# Patient Record
Sex: Male | Born: 1945
Health system: Southern US, Community
[De-identification: ages and names within clinical notes are randomized; demographics above are authoritative.]

## PROBLEM LIST (undated history)

## (undated) DIAGNOSIS — E119 Type 2 diabetes mellitus without complications: Secondary | ICD-10-CM

## (undated) DIAGNOSIS — M1990 Unspecified osteoarthritis, unspecified site: Secondary | ICD-10-CM

## (undated) DIAGNOSIS — M7542 Impingement syndrome of left shoulder: Secondary | ICD-10-CM

## (undated) DIAGNOSIS — M24112 Other articular cartilage disorders, left shoulder: Secondary | ICD-10-CM

## (undated) DIAGNOSIS — T7840XA Allergy, unspecified, initial encounter: Secondary | ICD-10-CM

## (undated) DIAGNOSIS — I10 Essential (primary) hypertension: Secondary | ICD-10-CM

## (undated) DIAGNOSIS — N529 Male erectile dysfunction, unspecified: Secondary | ICD-10-CM

## (undated) DIAGNOSIS — E7800 Pure hypercholesterolemia, unspecified: Secondary | ICD-10-CM

## (undated) HISTORY — DX: Essential (primary) hypertension: I10

## (undated) HISTORY — DX: Allergy, unspecified, initial encounter: T78.40XA

## (undated) HISTORY — DX: Unspecified osteoarthritis, unspecified site: M19.90

## (undated) HISTORY — DX: Male erectile dysfunction, unspecified: N52.9

---

## 2012-12-06 ENCOUNTER — Ambulatory Visit
Admission: RE | Admit: 2012-12-06 | Discharge: 2012-12-06 | Disposition: A | Discharge status: Home / Self Care | Payer: BC Managed Care – PPO | Source: Ambulatory Visit | Attending: Family Medicine | Admitting: Family Medicine

## 2012-12-06 ENCOUNTER — Other Ambulatory Visit: Payer: Self-pay | Admitting: Family Medicine

## 2012-12-06 DIAGNOSIS — M545 Low back pain: Secondary | ICD-10-CM

## 2013-11-10 ENCOUNTER — Encounter: Payer: Self-pay | Admitting: Internal Medicine

## 2013-12-18 ENCOUNTER — Ambulatory Visit (AMBULATORY_SURGERY_CENTER): Payer: Self-pay

## 2013-12-18 VITALS — Ht 74.0 in | Wt 259.4 lb

## 2013-12-18 DIAGNOSIS — Z1211 Encounter for screening for malignant neoplasm of colon: Secondary | ICD-10-CM

## 2013-12-18 MED ORDER — MOVIPREP 100 G PO SOLR: 1.0000 | Freq: Once | ORAL | Status: DC

## 2013-12-18 NOTE — Progress Notes (Signed)
No allergies to eggs or soy No diet/weight loss meds No home oxygen No previous anesthesia Has email. Emmi instructions given for colonoscopy.

## 2013-12-22 ENCOUNTER — Encounter: Payer: Self-pay | Admitting: Internal Medicine

## 2013-12-23 ENCOUNTER — Ambulatory Visit: Payer: Medicare Other

## 2013-12-23 ENCOUNTER — Other Ambulatory Visit: Payer: Self-pay | Admitting: Neurosurgery

## 2013-12-23 DIAGNOSIS — I498 Other specified cardiac arrhythmias: Secondary | ICD-10-CM

## 2013-12-25 ENCOUNTER — Ambulatory Visit (INDEPENDENT_AMBULATORY_CARE_PROVIDER_SITE_OTHER): Payer: Medicare Other | Admitting: Cardiology

## 2013-12-25 ENCOUNTER — Encounter: Payer: Self-pay | Admitting: Cardiology

## 2013-12-25 VITALS — BP 140/78 | HR 90 | Ht 74.0 in | Wt 261.0 lb

## 2013-12-25 DIAGNOSIS — R0989 Other specified symptoms and signs involving the circulatory and respiratory systems: Secondary | ICD-10-CM

## 2013-12-25 DIAGNOSIS — E785 Hyperlipidemia, unspecified: Secondary | ICD-10-CM

## 2013-12-25 DIAGNOSIS — R0602 Shortness of breath: Secondary | ICD-10-CM

## 2013-12-25 DIAGNOSIS — R0600 Dyspnea, unspecified: Secondary | ICD-10-CM | POA: Insufficient documentation

## 2013-12-25 DIAGNOSIS — R0609 Other forms of dyspnea: Secondary | ICD-10-CM

## 2013-12-25 DIAGNOSIS — I10 Essential (primary) hypertension: Secondary | ICD-10-CM

## 2013-12-25 LAB — CBC WITH DIFFERENTIAL/PLATELET
Basophils Absolute: 0 10*3/uL (ref 0.0–0.1)
Basophils Relative: 0.5 % (ref 0.0–3.0)
Eosinophils Absolute: 0.7 10*3/uL (ref 0.0–0.7)
Eosinophils Relative: 9.5 % — ABNORMAL HIGH (ref 0.0–5.0)
HCT: 43.5 % (ref 39.0–52.0)
Hemoglobin: 14.8 g/dL (ref 13.0–17.0)
Lymphocytes Relative: 17.8 % (ref 12.0–46.0)
Lymphs Abs: 1.3 10*3/uL (ref 0.7–4.0)
MCHC: 33.9 g/dL (ref 30.0–36.0)
MCV: 87.5 fl (ref 78.0–100.0)
Monocytes Absolute: 0.6 10*3/uL (ref 0.1–1.0)
Monocytes Relative: 7.4 % (ref 3.0–12.0)
Neutro Abs: 4.9 10*3/uL (ref 1.4–7.7)
Neutrophils Relative %: 64.8 % (ref 43.0–77.0)
Platelets: 189 10*3/uL (ref 150.0–400.0)
RBC: 4.97 Mil/uL (ref 4.22–5.81)
RDW: 13.6 % (ref 11.5–15.5)
WBC: 7.6 10*3/uL (ref 4.0–10.5)

## 2013-12-25 LAB — COMPREHENSIVE METABOLIC PANEL
ALT: 25 U/L (ref 0–53)
AST: 25 U/L (ref 0–37)
Albumin: 4.1 g/dL (ref 3.5–5.2)
Alkaline Phosphatase: 66 U/L (ref 39–117)
BUN: 14 mg/dL (ref 6–23)
CO2: 30 mEq/L (ref 19–32)
Calcium: 9.8 mg/dL (ref 8.4–10.5)
Chloride: 102 mEq/L (ref 96–112)
Creatinine, Ser: 1.2 mg/dL (ref 0.4–1.5)
GFR: 64.5 mL/min (ref >60.00)
Glucose, Bld: 115 mg/dL — ABNORMAL HIGH (ref 70–99)
Potassium: 4.4 mEq/L (ref 3.5–5.1)
Sodium: 140 mEq/L (ref 135–145)
Total Bilirubin: 0.5 mg/dL (ref 0.2–1.2)
Total Protein: 6.6 g/dL (ref 6.0–8.3)

## 2013-12-25 LAB — TSH: TSH: 1.2 u[IU]/mL (ref 0.35–4.50)

## 2013-12-25 LAB — HEMOGLOBIN A1C: Hgb A1c MFr Bld: 5.6 % (ref 4.6–6.5)

## 2013-12-25 NOTE — Patient Instructions (Signed)
Your physician recommends that you continue on your current medications as directed. Please refer to the Current Medication list given to you today.  Your physician recommends that you return for lab work in: TODAY (TSH, CBC, CMET, HBG A1C)  Your physician has requested that you have an exercise stress myoview. For further information please visit [email or web address]. Please follow instruction sheet, as given.  Your physician recommends that you schedule a follow-up appointment in: PENDING YOUR TEST RESULTS

## 2013-12-25 NOTE — Progress Notes (Signed)
Patient ID: Christian Hart, male   DOB: 05/18/1946, 68 y.o.   MRN: [id number]    Patient Name: Christian Hart Date of Encounter: 12/25/2013  Primary Care Provider:  Shaw, William, MD Primary Cardiologist:  Katarina H Nelson  Problem List   Past Medical History  Diagnosis Date  . Hypertension    No past surgical history on file.   Allergies  Allergies  Allergen Reactions  . Statins     myalgias   HPI  A very pleasant 68-year-old gentleman with prior medical history of obesity, hyperlipidemia and hypertension who is coming with concerns of fatigue and progressively worsening dyspnea on exertion. The patient states that he has quite active work that include significant amount of walking and he has noticed lately that he has been episodes where he just has to sit down and rest for a while before he can continue working. He also feels overall more tired than he used to in the past and doesn't feel rested in the morning.  He states that his brother had myocardial infarction at age of 65 and his mother had stroke in her 70s. He denies lower extremity edema, orthopnea, paroxysmal nocturnal dyspnea, palpitations or syncope. He denies any chest pain.  Home Medications  Prior to Admission medications   Medication Sig Start Date End Date Taking? Authorizing Provider  amLODipine (NORVASC) 5 MG tablet Take 5 mg by mouth daily.   Yes Historical Provider, MD  CRESTOR 5 MG tablet  12/19/13  Yes Historical Provider, MD  hydrochlorothiazide (HYDRODIURIL) 25 MG tablet Take 25 mg by mouth daily.   Yes Historical Provider, MD  MOVIPREP 100 G SOLR Take 1 kit (200 g total) by mouth once. 12/18/13  Yes Jay M Pyrtle, MD    Family History  Family History  Problem Relation Age of Onset  . Colon cancer Neg Hx   . Pancreatic cancer Neg Hx   . Rectal cancer Neg Hx   . Stomach cancer Neg Hx     Social History  History   Social History  . Marital Status: Married    Spouse Name: N/A    Number of Children:  N/A  . Years of Education: N/A   Occupational History  . Not on file.   Social History Main Topics  . Smoking status: Former Smoker    Types: Cigars    Quit date: 12/18/1972  . Smokeless tobacco: Never Used  . Alcohol Use: 1.8 - 2.4 oz/week    3-4 Glasses of wine per week  . Drug Use: No  . Sexual Activity: Not on file   Other Topics Concern  . Not on file   Social History Narrative  . No narrative on file     Review of Systems, as per HPI, otherwise negative General:  No chills, fever, night sweats or weight changes.  Cardiovascular:  No chest pain, dyspnea on exertion, edema, orthopnea, palpitations, paroxysmal nocturnal dyspnea. Dermatological: No rash, lesions/masses Respiratory: No cough, dyspnea Urologic: No hematuria, dysuria Abdominal:   No nausea, vomiting, diarrhea, bright red blood per rectum, melena, or hematemesis Neurologic:  No visual changes, wkns, changes in mental status. All other systems reviewed and are otherwise negative except as noted above.  Physical Exam  Blood pressure 140/78, pulse 90, height 6' 2" (1.88 m), weight 261 lb (118.389 kg).  General: Pleasant, NAD Psych: Normal affect. Neuro: Alert and oriented X 3. Moves all extremities spontaneously. HEENT: Normal  Neck: Supple without bruits or JVD. Lungs:  Resp regular and   unlabored, CTA. Heart: RRR no s3, s4, or murmurs. Abdomen: Soft, non-tender, non-distended, BS + x 4.  Extremities: No clubbing, cyanosis or edema. DP/PT/Radials 2+ and equal bilaterally.  Labs: None  Accessory Clinical Findings  Echocardiogram - none  ECG -  normal sinus rhythm, 86 beats per minutes, normal EKG.    Assessment & Plan  A pleasant 68-year-old gentleman   1. progressively worsening dyspnea on exertion and fatigue - we will order an exercise nuclear stress test to rule out ischemia. We will also check TSH, CBC, and hemoglobin A1c  2. Hypertension - borderline today, he won't change his medicines  instead move well follow blood pressure at rest and response to stress during the stress test and adjust medication appropriately.  3. Lipids - followed by primary care physician currently on Crestor 5 mg daily.  Followup in one month.  Katarina H Nelson, MD, FACC 12/25/2013, 8:03 AM     

## 2013-12-25 NOTE — Progress Notes (Signed)
Patient ID: Christian Hart, male   DOB: [birth year], 68 y.o.   MRN: [id number]  He received additional information form patient primary care with lab results in March 2015 Triglycerides 284 HDL 42 LDL 141 PSA 2.69 AST 22 ALT 26 BNP less than 5 Hemoglobin A1c 5.4 TSH 1.4  Dorothy Spark 12/25/2013

## 2014-01-01 ENCOUNTER — Ambulatory Visit (AMBULATORY_SURGERY_CENTER): Payer: Medicare Other | Admitting: Internal Medicine

## 2014-01-01 ENCOUNTER — Encounter: Payer: Self-pay | Admitting: Internal Medicine

## 2014-01-01 VITALS — BP 155/94 | HR 87 | Temp 96.3°F | Resp 18 | Ht 74.0 in | Wt 259.0 lb

## 2014-01-01 DIAGNOSIS — D126 Benign neoplasm of colon, unspecified: Secondary | ICD-10-CM

## 2014-01-01 DIAGNOSIS — D1330 Benign neoplasm of unspecified part of small intestine: Secondary | ICD-10-CM

## 2014-01-01 DIAGNOSIS — Z1211 Encounter for screening for malignant neoplasm of colon: Secondary | ICD-10-CM

## 2014-01-01 HISTORY — PX: COLONOSCOPY WITH PROPOFOL: SHX5780

## 2014-01-01 MED ORDER — SODIUM CHLORIDE 0.9 % IV SOLN: 500.0000 mL | INTRAVENOUS | Status: DC

## 2014-01-01 NOTE — Op Note (Signed)
[facility]  [address] [place], [id number]   COLONOSCOPY PROCEDURE REPORT  PATIENT: Christian Hart, Christian Hart  MR#: [id number] BIRTHDATE: 03-Feb-1946 , 54  yrs. old GENDER: Male ENDOSCOPIST: Jerene Bears, MD REFERRED ME:[id number] Shaw, M.D. PROCEDURE DATE:  01/01/2014 PROCEDURE:   Colonoscopy with snare polypectomy and Colonoscopy with biopsy First Screening Colonoscopy - Avg.  risk and is 50 yrs.  old or older Yes.  Prior Negative Screening - Now for repeat screening. N/A  History of Adenoma - Now for follow-up colonoscopy & has been > or = to 3 yrs.  N/A  Polyps Removed Today? Yes. ASA CLASS:   Class II INDICATIONS:average risk screening and first colonoscopy. MEDICATIONS: MAC sedation, administered by CRNA and propofol (Diprivan) 450mg  IV  DESCRIPTION OF PROCEDURE:   After the risks benefits and alternatives of the procedure were thoroughly explained, informed consent was obtained.  A digital rectal exam revealed no rectal mass.   The LB [id number]  endoscope was introduced through the anus and advanced to the terminal ileum which was intubated for a short distance. No adverse events experienced.   The quality of the prep was good, using MoviPrep  The instrument was then slowly withdrawn as the colon was fully examined.  COLON FINDINGS: The mucosa appeared normal in the terminal ileum. Three sessile polyps measuring 4-5 mm in size were found in the ascending colon and sigmoid colon.  Polypectomy was performed using cold snare.  All resections were complete and all polyp tissue was completely retrieved.   Lipomatous IC valve.  Cold forcep biopsies to exclude adenoma.   There was mild scattered diverticulosis noted in the descending colon and sigmoid colon.  Retroflexed views revealed internal hemorrhoids. The time to cecum=2 minutes 46 seconds.  Withdrawal time=20 minutes 57 seconds.  The scope was withdrawn and the procedure completed. COMPLICATIONS: There were  no complications.  ENDOSCOPIC IMPRESSION: 1.   Normal mucosa in the terminal ileum 2.   Three sessile polyps measuring 4-5 mm in size were found in the ascending colon and sigmoid colon; Polypectomy was performed using cold snare 3.   Lipomatous IC valve.  Cold forcep biopsies to exclude adenoma 4.   There was mild diverticulosis noted in the descending colon and sigmoid colon  RECOMMENDATIONS: 1.  Await pathology results 2.  High fiber diet 3.  Timing of repeat colonoscopy will be determined by pathology findings. 4.  You will receive a letter within 1-2 weeks with the results of your biopsy as well as final recommendations.  Please call my office if you have not received a letter after 3 weeks.   eSigned:  Jerene Bears, MD 01/01/2014 9:11 AM      cc: The Patient and Mayra Neer, MD

## 2014-01-01 NOTE — Patient Instructions (Addendum)
  YOU SHOULD EXPECT: Some feelings of bloating in the abdomen. Passage of more gas than usual.  Walking can help get rid of the air that was put into your GI tract during the procedure and reduce the bloating. If you had a lower endoscopy (such as a colonoscopy or flexible sigmoidoscopy) you may notice spotting of blood in your stool or on the toilet paper. If you underwent a bowel prep for your procedure, then you may not have a normal bowel movement for a few days.  DIET: Your first meal following the procedure should be a light meal and then it is ok to progress to your normal diet.  A half-sandwich or bowl of soup is an example of a good first meal.  Heavy or fried foods are harder to digest and may make you feel nauseous or bloated.  Likewise meals heavy in dairy and vegetables can cause extra gas to form and this can also increase the bloating.  Drink plenty of fluids but you should avoid alcoholic beverages for 24 hours.  ACTIVITY: Your care partner should take you home directly after the procedure.  You should plan to take it easy, moving slowly for the rest of the day.  You can resume normal activity the day after the procedure however you should NOT DRIVE or use heavy machinery for 24 hours (because of the sedation medicines used during the test).    SYMPTOMS TO REPORT IMMEDIATELY: A gastroenterologist can be reached at any hour.  During normal business hours, 8:30 AM to 5:00 PM Monday through Friday, call [phone].  After hours and on weekends, please call the GI answering service at [phone] who will take a message and have the physician on call contact you.   Following lower endoscopy (colonoscopy or flexible sigmoidoscopy):  Excessive amounts of blood in the stool  Significant tenderness or worsening of abdominal pains  Swelling of the abdomen that is new, acute  Fever of 100F or higher  FOLLOW UP: If any biopsies were taken you will be contacted by phone or by letter  within the next 1-3 weeks.  Call your gastroenterologist if you have not heard about the biopsies in 3 weeks.  Our staff will call the home number listed on your records the next business day following your procedure to check on you and address any questions or concerns that you may have at that time regarding the information given to you following your procedure. This is a courtesy call and so if there is no answer at the home number and we have not heard from you through the emergency physician on call, we will assume that you have returned to your regular daily activities without incident.  SIGNATURES/CONFIDENTIALITY: You and/or your care partner have signed paperwork which will be entered into your electronic medical record.  These signatures attest to the fact that that the information above on your After Visit Summary has been reviewed and is understood.  Full responsibility of the confidentiality of this discharge information lies with you and/or your care-partner.

## 2014-01-01 NOTE — Progress Notes (Signed)
Called to room to assist during endoscopic procedure.  Patient ID and intended procedure confirmed with present staff. Received instructions for my participation in the procedure from the performing physician.  

## 2014-01-02 ENCOUNTER — Telehealth: Payer: Self-pay | Admitting: *Deleted

## 2014-01-02 NOTE — Telephone Encounter (Signed)
  Follow up Call-  Call back number 01/01/2014  Post procedure Call Back phone  # [phone]  Permission to leave phone message Yes     Patient questions:  Do you have a fever, pain , or abdominal swelling? no Pain Score  0 *  Have you tolerated food without any problems? yes  Have you been able to return to your normal activities? yes  Do you have any questions about your discharge instructions: Diet   no Medications  no Follow up visit  no  Do you have questions or concerns about your Care? no  Actions: * If pain score is 4 or above: No action needed, pain <4.

## 2014-01-08 ENCOUNTER — Ambulatory Visit (HOSPITAL_COMMUNITY): Payer: Medicare Other | Attending: Cardiovascular Disease | Admitting: Radiology

## 2014-01-08 VITALS — BP 141/83 | HR 80 | Ht 74.0 in | Wt 254.0 lb

## 2014-01-08 DIAGNOSIS — R5381 Other malaise: Secondary | ICD-10-CM | POA: Insufficient documentation

## 2014-01-08 DIAGNOSIS — R5383 Other fatigue: Secondary | ICD-10-CM

## 2014-01-08 DIAGNOSIS — R0989 Other specified symptoms and signs involving the circulatory and respiratory systems: Principal | ICD-10-CM | POA: Insufficient documentation

## 2014-01-08 DIAGNOSIS — R0602 Shortness of breath: Secondary | ICD-10-CM

## 2014-01-08 DIAGNOSIS — R0609 Other forms of dyspnea: Secondary | ICD-10-CM | POA: Insufficient documentation

## 2014-01-08 MED ORDER — TECHNETIUM TC 99M SESTAMIBI GENERIC - CARDIOLITE
33.0000 | Freq: Once | INTRAVENOUS | Status: AC | PRN
  Administered 2014-01-08: 33 via INTRAVENOUS

## 2014-01-08 MED ORDER — TECHNETIUM TC 99M SESTAMIBI GENERIC - CARDIOLITE
11.0000 | Freq: Once | INTRAVENOUS | Status: AC | PRN
  Administered 2014-01-08: 11 via INTRAVENOUS

## 2014-01-08 NOTE — Progress Notes (Signed)
[facility] [address] [place], [place] [address] [phone]    Cardiology Nuclear Med Study  Christian Hart is a 68 y.o. male     MRN : [id number]     DOB: Aug 20, 1945  Procedure Date: 01/08/2014  Nuclear Med Background Indication for Stress Test:  Evaluation for Ischemia History:  No known CAD Cardiac Risk Factors: Family History - CAD, History of Smoking, Hypertension and Lipids  Symptoms:  DOE and Fatigue   Nuclear Pre-Procedure Caffeine/Decaff Intake:  None> 12 hrs NPO After: 7:30pm   Lungs:  clear O2 Sat: 97% on room air. IV 0.9% NS with Angio Cath:  22g  IV Site: R Hand x 1, tolerated well IV Started by:  Irven Baltimore, RN  Chest Size (in):  46 Cup Size: n/a  Height: 6\' 2"  (1.88 m)  Weight:  254 lb (115.214 kg)  BMI:  Body mass index is 32.6 kg/(m^2). Tech Comments:  Patient took Norvasc this am.    Nuclear Med Study 1 or 2 day study: 1 day  Stress Test Type:  Stress  Reading MD: N/A  Order Authorizing Provider:  Ena Dawley, MD  Resting Radionuclide: Technetium 71m Sestamibi  Resting Radionuclide Dose: 11.0 mCi   Stress Radionuclide:  Technetium 49m Sestamibi  Stress Radionuclide Dose: 33.0 mCi           Stress Protocol Rest HR: 80 Stress HR: 150  Rest BP: 141/83 Stress BP: 213/113  Exercise Time (min): 6:00 METS: 7.0           Dose of Adenosine (mg):  n/a Dose of Lexiscan: n/a mg  Dose of Atropine (mg): n/a Dose of Dobutamine: n/a mcg/kg/min (at max HR)  Stress Test Technologist: Glade Lloyd, BS-ES  Nuclear Technologist:  Charlton Amor, CNMT     Rest Procedure:  Myocardial perfusion imaging was performed at rest 45 minutes following the intravenous administration of Technetium 75m Sestamibi. Rest ECG: NSR with nonspecific T wave abnormality  Stress Procedure:  The patient exercised on the treadmill utilizing the Bruce Protocol for 6:00 minutes. The patient stopped due to fatigue and denied any chest pain.   Technetium 39m Sestamibi was injected at peak exercise and myocardial perfusion imaging was performed after a brief delay. Stress ECG: 42mm of horizontal to upsloping ST segment depression at peak exercise that became horizontal in recovery  QPS Raw Data Images:  Mild diaphragmatic attenuation.  Normal left ventricular size. Stress Images:  Normal homogeneous uptake in all areas of the myocardium. Rest Images:  Normal homogeneous uptake in all areas of the myocardium. Subtraction (SDS):  No evidence of ischemia. Transient Ischemic Dilatation (Normal <1.22):  0.84 Lung/Heart Ratio (Normal <0.45):  0.28  Quantitative Gated Spect Images QGS EDV:  88 ml QGS ESV:  35 ml  Impression Exercise Capacity:  Fair exercise capacity. BP Response:  Hypertensive blood pressure response. Clinical Symptoms:  No significant symptoms noted. ECG Impression:  1mm of horizontal to upsloping ST segment depression at peak exercise that became horizontal in recovery. Comparison with Prior Nuclear Study: No images to compare  Overall Impression:  Low risk stress nuclear study no ischemia noted but EKG showed horizontal to upsloping ST segements at peak exercise that became horizontally depressed in recovery..  LV Ejection Fraction: 60%.  LV Wall Motion:  NL LV Function; NL Wall Motion  Signed: Fransico Him, MD [facility]

## 2014-01-12 ENCOUNTER — Encounter: Payer: Self-pay | Admitting: Internal Medicine

## 2014-01-13 ENCOUNTER — Telehealth: Payer: Self-pay | Admitting: *Deleted

## 2014-01-13 MED ORDER — AMLODIPINE BESYLATE 10 MG PO TABS: 10.0000 mg | ORAL_TABLET | Freq: Every day | ORAL | Status: DC

## 2014-01-13 NOTE — Telephone Encounter (Signed)
Pt notified of stress test results showing no ischemia, but significantly high BP elevation on exertion per Dr Meda Coffee.  Per Dr Meda Coffee this pt needs to increase his Amlodipine to 10 mg po daily.  Pt aware of this med change and agrees with this plan.  Sent new dose change to pts pharmacy of choice.

## 2014-01-13 NOTE — Telephone Encounter (Signed)
Message copied by Nuala Alpha on Tue Jan 13, 2014 11:24 AM ------      Message from: Dorothy Spark      Created: Tue Jan 13, 2014 12:11 AM       No ischemia on stress test but significantly increased BO on exertion (over 200), I would recommend to increase amlodipine to 10 mg po daily ------

## 2014-11-13 DIAGNOSIS — R69 Illness, unspecified: Secondary | ICD-10-CM | POA: Diagnosis not present

## 2014-11-20 DIAGNOSIS — R972 Elevated prostate specific antigen [PSA]: Secondary | ICD-10-CM | POA: Diagnosis not present

## 2014-11-20 DIAGNOSIS — Z6834 Body mass index (BMI) 34.0-34.9, adult: Secondary | ICD-10-CM | POA: Diagnosis not present

## 2014-11-20 DIAGNOSIS — R7301 Impaired fasting glucose: Secondary | ICD-10-CM | POA: Diagnosis not present

## 2014-11-20 DIAGNOSIS — E6609 Other obesity due to excess calories: Secondary | ICD-10-CM | POA: Diagnosis not present

## 2014-11-20 DIAGNOSIS — E780 Pure hypercholesterolemia: Secondary | ICD-10-CM | POA: Diagnosis not present

## 2014-11-20 DIAGNOSIS — F411 Generalized anxiety disorder: Secondary | ICD-10-CM | POA: Diagnosis not present

## 2014-11-20 DIAGNOSIS — I10 Essential (primary) hypertension: Secondary | ICD-10-CM | POA: Diagnosis not present

## 2014-11-20 DIAGNOSIS — Z0001 Encounter for general adult medical examination with abnormal findings: Secondary | ICD-10-CM | POA: Diagnosis not present

## 2015-04-01 ENCOUNTER — Other Ambulatory Visit: Payer: Self-pay | Admitting: Cardiology

## 2015-05-28 DIAGNOSIS — E7800 Pure hypercholesterolemia, unspecified: Secondary | ICD-10-CM | POA: Diagnosis not present

## 2015-05-28 DIAGNOSIS — Z6835 Body mass index (BMI) 35.0-35.9, adult: Secondary | ICD-10-CM | POA: Diagnosis not present

## 2015-05-28 DIAGNOSIS — R7301 Impaired fasting glucose: Secondary | ICD-10-CM | POA: Diagnosis not present

## 2015-05-28 DIAGNOSIS — Z23 Encounter for immunization: Secondary | ICD-10-CM | POA: Diagnosis not present

## 2015-05-28 DIAGNOSIS — E669 Obesity, unspecified: Secondary | ICD-10-CM | POA: Diagnosis not present

## 2015-05-28 DIAGNOSIS — I10 Essential (primary) hypertension: Secondary | ICD-10-CM | POA: Diagnosis not present

## 2015-11-05 DIAGNOSIS — H5210 Myopia, unspecified eye: Secondary | ICD-10-CM | POA: Diagnosis not present

## 2015-11-05 DIAGNOSIS — Z0100 Encounter for examination of eyes and vision without abnormal findings: Secondary | ICD-10-CM | POA: Diagnosis not present

## 2015-11-05 DIAGNOSIS — H524 Presbyopia: Secondary | ICD-10-CM | POA: Diagnosis not present

## 2015-12-21 DIAGNOSIS — Z0000 Encounter for general adult medical examination without abnormal findings: Secondary | ICD-10-CM | POA: Diagnosis not present

## 2015-12-21 DIAGNOSIS — K5730 Diverticulosis of large intestine without perforation or abscess without bleeding: Secondary | ICD-10-CM | POA: Diagnosis not present

## 2015-12-21 DIAGNOSIS — R972 Elevated prostate specific antigen [PSA]: Secondary | ICD-10-CM | POA: Diagnosis not present

## 2015-12-21 DIAGNOSIS — E669 Obesity, unspecified: Secondary | ICD-10-CM | POA: Diagnosis not present

## 2015-12-21 DIAGNOSIS — I8310 Varicose veins of unspecified lower extremity with inflammation: Secondary | ICD-10-CM | POA: Diagnosis not present

## 2015-12-21 DIAGNOSIS — I10 Essential (primary) hypertension: Secondary | ICD-10-CM | POA: Diagnosis not present

## 2015-12-21 DIAGNOSIS — R7301 Impaired fasting glucose: Secondary | ICD-10-CM | POA: Diagnosis not present

## 2015-12-21 DIAGNOSIS — F411 Generalized anxiety disorder: Secondary | ICD-10-CM | POA: Diagnosis not present

## 2015-12-21 DIAGNOSIS — N529 Male erectile dysfunction, unspecified: Secondary | ICD-10-CM | POA: Diagnosis not present

## 2015-12-21 DIAGNOSIS — M109 Gout, unspecified: Secondary | ICD-10-CM | POA: Diagnosis not present

## 2016-05-19 DIAGNOSIS — J069 Acute upper respiratory infection, unspecified: Secondary | ICD-10-CM | POA: Diagnosis not present

## 2016-06-27 ENCOUNTER — Other Ambulatory Visit: Payer: Self-pay | Admitting: Family Medicine

## 2016-06-27 ENCOUNTER — Ambulatory Visit
Admission: RE | Admit: 2016-06-27 | Discharge: 2016-06-27 | Disposition: A | Discharge status: Home / Self Care | Payer: Self-pay | Source: Ambulatory Visit | Attending: Family Medicine | Admitting: Family Medicine

## 2016-06-27 DIAGNOSIS — R05 Cough: Secondary | ICD-10-CM

## 2016-06-27 DIAGNOSIS — R059 Cough, unspecified: Secondary | ICD-10-CM

## 2016-06-27 DIAGNOSIS — R7301 Impaired fasting glucose: Secondary | ICD-10-CM | POA: Diagnosis not present

## 2016-06-27 DIAGNOSIS — E7800 Pure hypercholesterolemia, unspecified: Secondary | ICD-10-CM | POA: Diagnosis not present

## 2016-06-27 DIAGNOSIS — I10 Essential (primary) hypertension: Secondary | ICD-10-CM | POA: Diagnosis not present

## 2016-07-14 DIAGNOSIS — Z23 Encounter for immunization: Secondary | ICD-10-CM | POA: Diagnosis not present

## 2016-10-06 DIAGNOSIS — E782 Mixed hyperlipidemia: Secondary | ICD-10-CM | POA: Diagnosis not present

## 2016-10-06 DIAGNOSIS — B356 Tinea cruris: Secondary | ICD-10-CM | POA: Diagnosis not present

## 2016-10-24 ENCOUNTER — Encounter: Payer: Self-pay | Admitting: *Deleted

## 2016-11-09 ENCOUNTER — Encounter: Payer: Self-pay | Admitting: Internal Medicine

## 2017-01-19 DIAGNOSIS — M109 Gout, unspecified: Secondary | ICD-10-CM | POA: Diagnosis not present

## 2017-01-19 DIAGNOSIS — Z125 Encounter for screening for malignant neoplasm of prostate: Secondary | ICD-10-CM | POA: Diagnosis not present

## 2017-01-19 DIAGNOSIS — E669 Obesity, unspecified: Secondary | ICD-10-CM | POA: Diagnosis not present

## 2017-01-19 DIAGNOSIS — E782 Mixed hyperlipidemia: Secondary | ICD-10-CM | POA: Diagnosis not present

## 2017-01-19 DIAGNOSIS — I10 Essential (primary) hypertension: Secondary | ICD-10-CM | POA: Diagnosis not present

## 2017-01-19 DIAGNOSIS — F411 Generalized anxiety disorder: Secondary | ICD-10-CM | POA: Diagnosis not present

## 2017-01-19 DIAGNOSIS — Z0000 Encounter for general adult medical examination without abnormal findings: Secondary | ICD-10-CM | POA: Diagnosis not present

## 2017-01-19 DIAGNOSIS — K5730 Diverticulosis of large intestine without perforation or abscess without bleeding: Secondary | ICD-10-CM | POA: Diagnosis not present

## 2017-01-19 DIAGNOSIS — R7301 Impaired fasting glucose: Secondary | ICD-10-CM | POA: Diagnosis not present

## 2017-01-19 DIAGNOSIS — N529 Male erectile dysfunction, unspecified: Secondary | ICD-10-CM | POA: Diagnosis not present

## 2017-01-19 DIAGNOSIS — Z1159 Encounter for screening for other viral diseases: Secondary | ICD-10-CM | POA: Diagnosis not present

## 2017-04-18 DIAGNOSIS — M25562 Pain in left knee: Secondary | ICD-10-CM | POA: Diagnosis not present

## 2017-05-11 DIAGNOSIS — R7303 Prediabetes: Secondary | ICD-10-CM | POA: Diagnosis not present

## 2017-05-11 DIAGNOSIS — E782 Mixed hyperlipidemia: Secondary | ICD-10-CM | POA: Diagnosis not present

## 2017-06-04 DIAGNOSIS — S83242A Other tear of medial meniscus, current injury, left knee, initial encounter: Secondary | ICD-10-CM | POA: Diagnosis not present

## 2017-06-07 DIAGNOSIS — Z23 Encounter for immunization: Secondary | ICD-10-CM | POA: Diagnosis not present

## 2017-06-07 DIAGNOSIS — R7301 Impaired fasting glucose: Secondary | ICD-10-CM | POA: Diagnosis not present

## 2017-06-09 DIAGNOSIS — M25562 Pain in left knee: Secondary | ICD-10-CM | POA: Diagnosis not present

## 2017-06-11 DIAGNOSIS — M25562 Pain in left knee: Secondary | ICD-10-CM | POA: Diagnosis not present

## 2017-07-16 DIAGNOSIS — M7582 Other shoulder lesions, left shoulder: Secondary | ICD-10-CM | POA: Diagnosis not present

## 2017-07-20 DIAGNOSIS — I10 Essential (primary) hypertension: Secondary | ICD-10-CM | POA: Diagnosis not present

## 2017-07-20 DIAGNOSIS — E669 Obesity, unspecified: Secondary | ICD-10-CM | POA: Diagnosis not present

## 2017-07-20 DIAGNOSIS — E119 Type 2 diabetes mellitus without complications: Secondary | ICD-10-CM | POA: Diagnosis not present

## 2017-07-20 DIAGNOSIS — Z6835 Body mass index (BMI) 35.0-35.9, adult: Secondary | ICD-10-CM | POA: Diagnosis not present

## 2017-08-21 ENCOUNTER — Ambulatory Visit: Payer: Self-pay | Admitting: Registered"

## 2017-09-26 ENCOUNTER — Other Ambulatory Visit: Payer: Self-pay | Admitting: Sports Medicine

## 2017-09-26 DIAGNOSIS — M25512 Pain in left shoulder: Secondary | ICD-10-CM

## 2017-09-30 ENCOUNTER — Ambulatory Visit
Admission: RE | Admit: 2017-09-30 | Discharge: 2017-09-30 | Disposition: A | Discharge status: Home / Self Care | Payer: PPO | Source: Ambulatory Visit | Attending: Sports Medicine | Admitting: Sports Medicine

## 2017-09-30 DIAGNOSIS — M25512 Pain in left shoulder: Secondary | ICD-10-CM

## 2017-09-30 DIAGNOSIS — S46012A Strain of muscle(s) and tendon(s) of the rotator cuff of left shoulder, initial encounter: Secondary | ICD-10-CM | POA: Diagnosis not present

## 2017-10-01 DIAGNOSIS — M7582 Other shoulder lesions, left shoulder: Secondary | ICD-10-CM | POA: Diagnosis not present

## 2017-10-03 DIAGNOSIS — E669 Obesity, unspecified: Secondary | ICD-10-CM | POA: Diagnosis not present

## 2017-10-03 DIAGNOSIS — Z6836 Body mass index (BMI) 36.0-36.9, adult: Secondary | ICD-10-CM | POA: Diagnosis not present

## 2017-10-03 DIAGNOSIS — E7800 Pure hypercholesterolemia, unspecified: Secondary | ICD-10-CM | POA: Diagnosis not present

## 2017-10-03 DIAGNOSIS — I10 Essential (primary) hypertension: Secondary | ICD-10-CM | POA: Diagnosis not present

## 2017-10-03 DIAGNOSIS — E119 Type 2 diabetes mellitus without complications: Secondary | ICD-10-CM | POA: Diagnosis not present

## 2017-10-09 DIAGNOSIS — M7582 Other shoulder lesions, left shoulder: Secondary | ICD-10-CM | POA: Diagnosis not present

## 2017-10-10 NOTE — H&P (Signed)
PREOPERATIVE H&P  Chief Complaint: Other articular cartilage disorders, left shoulder, impingment syndrome of left shoulder, strain of muscle(s) and tendon(s) of the rotator cuff of left shoulder, initial encounter LEFT  HPI: Christian Hart is a 72 y.o. male who presents for preoperative history and physical with a diagnosis of Other articular cartilage disorders, left shoulder, impingment syndrome of left shoulder, strain of muscle(s) and tendon(s) of the rotator cuff of left shoulder, initial encounter LEFT. Symptoms are rated as moderate to severe, and have been worsening.  This is significantly impairing activities of daily living.  He has elected for surgical management.   Past Medical History:  Diagnosis Date  . Anxiety   . ED (erectile dysfunction)   . Edema   . Elevated prostate specific antigen (PSA)   . Essential hypertension, benign   . Gout, unspecified   . Impaired fasting glucose   . Obesity   . Other malaise and fatigue   . Pure hypercholesterolemia   . Tachycardia, unspecified    No past surgical history on file. Social History   Socioeconomic History  . Marital status: Married    Spouse name: Not on file  . Number of children: Not on file  . Years of education: Not on file  . Highest education level: Not on file  Social Needs  . Financial resource strain: Not on file  . Food insecurity - worry: Not on file  . Food insecurity - inability: Not on file  . Transportation needs - medical: Not on file  . Transportation needs - non-medical: Not on file  Occupational History  . Not on file  Tobacco Use  . Smoking status: Former Smoker    Types: Cigars    Last attempt to quit: 12/18/1972    Years since quitting: 44.8  . Smokeless tobacco: Never Used  Substance and Sexual Activity  . Alcohol use: Yes    Alcohol/week: 1.8 - 2.4 oz    Types: 3 - 4 Glasses of wine per week  . Drug use: No  . Sexual activity: Not on file  Other Topics Concern  . Not on file  Social  History Narrative  . Not on file   Family History  Problem Relation Age of Onset  . Heart disease Mother   . CVA Mother   . Lung disease Father   . Colon cancer Neg Hx   . Pancreatic cancer Neg Hx   . Rectal cancer Neg Hx   . Stomach cancer Neg Hx    Allergies  Allergen Reactions  . Liptruzet [Ezetimibe-Atorvastatin] Swelling  . Cialis [Tadalafil]     Headache   . Citalopram Hydrobromide     Headache   . Levitra [Vardenafil]     Headache   . Statins     myalgias   Prior to Admission medications   Medication Sig Start Date End Date Taking? Authorizing Provider  amLODipine (NORVASC) 10 MG tablet TAKE 1 TABLET (10 MG TOTAL) BY MOUTH DAILY. 04/01/15   Dorothy Spark, MD  CRESTOR 5 MG tablet  12/19/13   [provider]  hydrochlorothiazide (HYDRODIURIL) 25 MG tablet Take 25 mg by mouth daily.    [provider]     Positive ROS: All other systems have been reviewed and were otherwise negative with the exception of those mentioned in the HPI and as above.  Physical Exam: General: Alert, no acute distress Cardiovascular: No pedal edema Respiratory: No cyanosis, no use of accessory musculature GI: No organomegaly, abdomen  is soft and non-tender Skin: No lesions in the area of chief complaint Neurologic: Sensation intact distally Psychiatric: Patient is competent for consent with normal mood and affect Lymphatic: No axillary or cervical lymphadenopathy  MUSCULOSKELETAL: L shoulder : 4/5 supra, wwp distally  Assessment: Other articular cartilage disorders, left shoulder, impingment syndrome of left shoulder, strain of muscle(s) and tendon(s) of the rotator cuff of left shoulder, initial encounter LEFT  Plan: Plan for Procedure(s): LEFT SHOULDER ARTHROSCOPY, DEBRIDEMENT WITH SUBACROMIAL DECOMPRESSION, ROTATOR CUFF REPAIR AND POSSIBLE BICEP TENODESIS  The risks benefits and alternatives were discussed with the patient including but not limited to the  risks of nonoperative treatment, versus surgical intervention including infection, bleeding, nerve injury,  blood clots, cardiopulmonary complications, morbidity, mortality, among others, and they were willing to proceed.   Hiram Gash, MD  10/10/2017 12:45 PM

## 2017-10-12 ENCOUNTER — Other Ambulatory Visit: Payer: Self-pay

## 2017-10-12 ENCOUNTER — Encounter (HOSPITAL_BASED_OUTPATIENT_CLINIC_OR_DEPARTMENT_OTHER): Payer: Self-pay | Admitting: *Deleted

## 2017-10-12 DIAGNOSIS — M7542 Impingement syndrome of left shoulder: Secondary | ICD-10-CM

## 2017-10-12 DIAGNOSIS — M24112 Other articular cartilage disorders, left shoulder: Secondary | ICD-10-CM

## 2017-10-12 HISTORY — DX: Other articular cartilage disorders, left shoulder: M24.112

## 2017-10-12 HISTORY — DX: Impingement syndrome of left shoulder: M75.42

## 2017-10-12 NOTE — Pre-Procedure Instructions (Signed)
To come for BMET and EKG 

## 2017-10-15 NOTE — H&P (Signed)
PREOPERATIVE H&P  Chief Complaint: Other articular cartilage disorders, left shoulder, impingment syndrome of left shoulder, strain of muscle(s) and tendon(s) of the rotator cuff of left shoulder, initial encounter LEFT  HPI: Christian Hart is a 72 y.o. male who presents for preoperative history and physical with a diagnosis of Other articular cartilage disorders, left shoulder, impingment syndrome of left shoulder, strain of muscle(s) and tendon(s) of the rotator cuff of left shoulder, initial encounter LEFT. Symptoms are rated as moderate to severe, and have been worsening.  This is significantly impairing activities of daily living.  He has elected for surgical management.   Past Medical History:  Diagnosis Date  . Articular cartilage disorder involving shoulder region, left 10/2017  . Diet-controlled diabetes mellitus ([place])   . ED (erectile dysfunction)   . High cholesterol   . Hypertension    states under control with meds., has been on med. x 5 yr.  . Shoulder impingement syndrome, left 10/2017   Past Surgical History:  Procedure Laterality Date  . COLONOSCOPY WITH PROPOFOL  01/01/2014   Social History   Socioeconomic History  . Marital status: Married    Spouse name: None  . Number of children: None  . Years of education: None  . Highest education level: None  Social Needs  . Financial resource strain: None  . Food insecurity - worry: None  . Food insecurity - inability: None  . Transportation needs - medical: None  . Transportation needs - non-medical: None  Occupational History  . None  Tobacco Use  . Smoking status: Never Smoker  . Smokeless tobacco: Never Used  Substance and Sexual Activity  . Alcohol use: Yes    Comment: 4-5 drinks/week (wine)  . Drug use: No  . Sexual activity: None  Other Topics Concern  . None  Social History Narrative  . None   Family History  Problem Relation Age of Onset  . Heart disease Mother   . CVA Mother   . Lung disease  Father    Allergies  Allergen Reactions  . Cialis [Tadalafil] Other (See Comments)    HEADACHE  . Citalopram Hydrobromide Other (See Comments)    HEADACHE   . Levitra [Vardenafil] Other (See Comments)    HEADACHE  . Liptruzet [Ezetimibe-Atorvastatin] Swelling    ANKLES  . Statins Other (See Comments)    MYALGIAS   Prior to Admission medications   Medication Sig Start Date End Date Taking? Authorizing Provider  amLODipine (NORVASC) 10 MG tablet TAKE 1 TABLET (10 MG TOTAL) BY MOUTH DAILY. 04/01/15  Yes Dorothy Spark, MD  CRESTOR 5 MG tablet  12/19/13  Yes [provider]  hydrochlorothiazide (HYDRODIURIL) 25 MG tablet Take 25 mg by mouth daily.   Yes [provider]  meloxicam (MOBIC) 15 MG tablet Take 15 mg by mouth daily.   Yes [provider]     Positive ROS: All other systems have been reviewed and were otherwise negative with the exception of those mentioned in the HPI and as above.  Physical Exam: General: Alert, no acute distress Cardiovascular: No pedal edema Respiratory: No cyanosis, no use of accessory musculature GI: No organomegaly, abdomen is soft and non-tender Skin: No lesions in the area of chief complaint Neurologic: Sensation intact distally Psychiatric: Patient is competent for consent with normal mood and affect Lymphatic: No axillary or cervical lymphadenopathy  MUSCULOSKELETAL: Left shoulder: 4/5 cuff, +obrian, +impingement  Assessment: Other articular cartilage disorders, left shoulder, impingment syndrome of left shoulder, strain  of muscle(s) and tendon(s) of the rotator cuff of left shoulder, initial encounter LEFT  Plan: Plan for Procedure(s): LEFT SHOULDER ARTHROSCOPY, DEBRIDEMENT WITH SUBACROMIAL DECOMPRESSION, ROTATOR CUFF REPAIR AND POSSIBLE BICEP TENODESIS  The risks benefits and alternatives were discussed with the patient including but not limited to the risks of nonoperative treatment, versus surgical  intervention including infection, bleeding, nerve injury,  blood clots, cardiopulmonary complications, morbidity, mortality, among others, and they were willing to proceed.   Hiram Gash, MD  10/15/2017 5:13 PM

## 2017-10-17 ENCOUNTER — Encounter (HOSPITAL_BASED_OUTPATIENT_CLINIC_OR_DEPARTMENT_OTHER)
Admission: RE | Admit: 2017-10-17 | Discharge: 2017-10-17 | Disposition: A | Discharge status: Home / Self Care | Payer: PPO | Source: Ambulatory Visit | Attending: Orthopaedic Surgery | Admitting: Orthopaedic Surgery

## 2017-10-17 DIAGNOSIS — M7542 Impingement syndrome of left shoulder: Secondary | ICD-10-CM | POA: Diagnosis not present

## 2017-10-17 DIAGNOSIS — M24112 Other articular cartilage disorders, left shoulder: Secondary | ICD-10-CM | POA: Diagnosis not present

## 2017-10-17 DIAGNOSIS — E669 Obesity, unspecified: Secondary | ICD-10-CM | POA: Diagnosis not present

## 2017-10-17 DIAGNOSIS — E119 Type 2 diabetes mellitus without complications: Secondary | ICD-10-CM | POA: Diagnosis not present

## 2017-10-17 DIAGNOSIS — M75102 Unspecified rotator cuff tear or rupture of left shoulder, not specified as traumatic: Secondary | ICD-10-CM | POA: Diagnosis not present

## 2017-10-17 DIAGNOSIS — Z79899 Other long term (current) drug therapy: Secondary | ICD-10-CM | POA: Diagnosis not present

## 2017-10-17 DIAGNOSIS — I10 Essential (primary) hypertension: Secondary | ICD-10-CM | POA: Diagnosis not present

## 2017-10-17 DIAGNOSIS — Z87891 Personal history of nicotine dependence: Secondary | ICD-10-CM | POA: Diagnosis not present

## 2017-10-17 DIAGNOSIS — M67814 Other specified disorders of tendon, left shoulder: Secondary | ICD-10-CM | POA: Diagnosis not present

## 2017-10-17 DIAGNOSIS — Z791 Long term (current) use of non-steroidal anti-inflammatories (NSAID): Secondary | ICD-10-CM | POA: Diagnosis not present

## 2017-10-17 DIAGNOSIS — Z6834 Body mass index (BMI) 34.0-34.9, adult: Secondary | ICD-10-CM | POA: Diagnosis not present

## 2017-10-17 DIAGNOSIS — E7800 Pure hypercholesterolemia, unspecified: Secondary | ICD-10-CM | POA: Diagnosis not present

## 2017-10-17 LAB — BASIC METABOLIC PANEL
ANION GAP: 12 (ref 5–15)
BUN: 8 mg/dL (ref 6–20)
CALCIUM: 9.8 mg/dL (ref 8.9–10.3)
CO2: 26 mmol/L (ref 22–32)
CREATININE: 1.11 mg/dL (ref 0.61–1.24)
Chloride: 101 mmol/L (ref 101–111)
GFR calc non Af Amer: >60 mL/min (ref >60)
Glucose, Bld: 133 mg/dL — ABNORMAL HIGH (ref 65–99)
Potassium: 3.7 mmol/L (ref 3.5–5.1)
SODIUM: 139 mmol/L (ref 135–145)

## 2017-10-18 ENCOUNTER — Ambulatory Visit (HOSPITAL_BASED_OUTPATIENT_CLINIC_OR_DEPARTMENT_OTHER)
Admission: RE | Admit: 2017-10-18 | Discharge: 2017-10-18 | Disposition: A | Discharge status: Home / Self Care | Payer: PPO | Source: Ambulatory Visit | Attending: Orthopaedic Surgery | Admitting: Orthopaedic Surgery

## 2017-10-18 ENCOUNTER — Other Ambulatory Visit: Payer: Self-pay

## 2017-10-18 ENCOUNTER — Encounter (HOSPITAL_BASED_OUTPATIENT_CLINIC_OR_DEPARTMENT_OTHER)
Admission: RE | Disposition: A | Discharge status: Home / Self Care | Payer: Self-pay | Source: Ambulatory Visit | Attending: Orthopaedic Surgery

## 2017-10-18 ENCOUNTER — Encounter (HOSPITAL_BASED_OUTPATIENT_CLINIC_OR_DEPARTMENT_OTHER): Payer: Self-pay

## 2017-10-18 ENCOUNTER — Ambulatory Visit (HOSPITAL_BASED_OUTPATIENT_CLINIC_OR_DEPARTMENT_OTHER): Payer: PPO | Admitting: Anesthesiology

## 2017-10-18 DIAGNOSIS — Z87891 Personal history of nicotine dependence: Secondary | ICD-10-CM | POA: Diagnosis not present

## 2017-10-18 DIAGNOSIS — E669 Obesity, unspecified: Secondary | ICD-10-CM | POA: Insufficient documentation

## 2017-10-18 DIAGNOSIS — Z79899 Other long term (current) drug therapy: Secondary | ICD-10-CM | POA: Diagnosis not present

## 2017-10-18 DIAGNOSIS — S46012A Strain of muscle(s) and tendon(s) of the rotator cuff of left shoulder, initial encounter: Secondary | ICD-10-CM | POA: Diagnosis not present

## 2017-10-18 DIAGNOSIS — E7800 Pure hypercholesterolemia, unspecified: Secondary | ICD-10-CM | POA: Insufficient documentation

## 2017-10-18 DIAGNOSIS — Z6834 Body mass index (BMI) 34.0-34.9, adult: Secondary | ICD-10-CM | POA: Diagnosis not present

## 2017-10-18 DIAGNOSIS — E119 Type 2 diabetes mellitus without complications: Secondary | ICD-10-CM | POA: Diagnosis not present

## 2017-10-18 DIAGNOSIS — G8918 Other acute postprocedural pain: Secondary | ICD-10-CM | POA: Diagnosis not present

## 2017-10-18 DIAGNOSIS — Z791 Long term (current) use of non-steroidal anti-inflammatories (NSAID): Secondary | ICD-10-CM | POA: Insufficient documentation

## 2017-10-18 DIAGNOSIS — M75102 Unspecified rotator cuff tear or rupture of left shoulder, not specified as traumatic: Secondary | ICD-10-CM | POA: Diagnosis not present

## 2017-10-18 DIAGNOSIS — M67814 Other specified disorders of tendon, left shoulder: Secondary | ICD-10-CM | POA: Insufficient documentation

## 2017-10-18 DIAGNOSIS — M7522 Bicipital tendinitis, left shoulder: Secondary | ICD-10-CM | POA: Diagnosis not present

## 2017-10-18 DIAGNOSIS — M24112 Other articular cartilage disorders, left shoulder: Secondary | ICD-10-CM | POA: Insufficient documentation

## 2017-10-18 DIAGNOSIS — I10 Essential (primary) hypertension: Secondary | ICD-10-CM | POA: Diagnosis not present

## 2017-10-18 DIAGNOSIS — M7542 Impingement syndrome of left shoulder: Secondary | ICD-10-CM | POA: Insufficient documentation

## 2017-10-18 HISTORY — DX: Type 2 diabetes mellitus without complications: E11.9

## 2017-10-18 HISTORY — DX: Other articular cartilage disorders, left shoulder: M24.112

## 2017-10-18 HISTORY — PX: SHOULDER ARTHROSCOPY WITH SUBACROMIAL DECOMPRESSION, ROTATOR CUFF REPAIR AND BICEP TENDON REPAIR: SHX5687

## 2017-10-18 HISTORY — DX: Impingement syndrome of left shoulder: M75.42

## 2017-10-18 HISTORY — DX: Pure hypercholesterolemia, unspecified: E78.00

## 2017-10-18 LAB — GLUCOSE, CAPILLARY
GLUCOSE-CAPILLARY: 135 mg/dL — AB (ref 65–99)
GLUCOSE-CAPILLARY: 168 mg/dL — AB (ref 65–99)

## 2017-10-18 SURGERY — SHOULDER ARTHROSCOPY WITH SUBACROMIAL DECOMPRESSION, ROTATOR CUFF REPAIR AND BICEP TENDON REPAIR
Anesthesia: General | Site: Shoulder | Laterality: Left

## 2017-10-18 MED ORDER — OXYCODONE HCL 5 MG PO TABS: ORAL_TABLET | ORAL | 0 refills | Status: AC

## 2017-10-18 MED ORDER — PHENYLEPHRINE 40 MCG/ML (10ML) SYRINGE FOR IV PUSH (FOR BLOOD PRESSURE SUPPORT)
PREFILLED_SYRINGE | INTRAVENOUS | Status: DC | PRN
  Administered 2017-10-18 (×2): 120 ug via INTRAVENOUS
  Administered 2017-10-18 (×2): 80 ug via INTRAVENOUS

## 2017-10-18 MED ORDER — OXYCODONE HCL 5 MG PO TABS: 5.0000 mg | ORAL_TABLET | Freq: Once | ORAL | Status: DC | PRN

## 2017-10-18 MED ORDER — CEFAZOLIN SODIUM-DEXTROSE 2-4 GM/100ML-% IV SOLN
INTRAVENOUS | Status: AC
  Filled 2017-10-18: qty 200

## 2017-10-18 MED ORDER — ONDANSETRON HCL 4 MG PO TABS: 4.0000 mg | ORAL_TABLET | Freq: Three times a day (TID) | ORAL | 1 refills | Status: AC | PRN

## 2017-10-18 MED ORDER — LIDOCAINE 2% (20 MG/ML) 5 ML SYRINGE
INTRAMUSCULAR | Status: DC | PRN
  Administered 2017-10-18: 100 mg via INTRAVENOUS

## 2017-10-18 MED ORDER — ACETAMINOPHEN 500 MG PO TABS: 1000.0000 mg | ORAL_TABLET | Freq: Three times a day (TID) | ORAL | 0 refills | Status: AC

## 2017-10-18 MED ORDER — MELOXICAM 15 MG PO TABS: 15.0000 mg | ORAL_TABLET | Freq: Every day | ORAL | 2 refills | Status: AC

## 2017-10-18 MED ORDER — CEFAZOLIN SODIUM-DEXTROSE 2-4 GM/100ML-% IV SOLN: 2.0000 g | INTRAVENOUS | Status: DC

## 2017-10-18 MED ORDER — PROPOFOL 10 MG/ML IV BOLUS
INTRAVENOUS | Status: AC
  Filled 2017-10-18: qty 20

## 2017-10-18 MED ORDER — CLONIDINE HCL (ANALGESIA) 100 MCG/ML EP SOLN
EPIDURAL | Status: DC | PRN
  Administered 2017-10-18: 80 ug

## 2017-10-18 MED ORDER — CHLORHEXIDINE GLUCONATE 4 % EX LIQD: 60.0000 mL | Freq: Once | CUTANEOUS | Status: DC

## 2017-10-18 MED ORDER — PROPOFOL 10 MG/ML IV BOLUS
INTRAVENOUS | Status: DC | PRN
  Administered 2017-10-18: 200 mg via INTRAVENOUS

## 2017-10-18 MED ORDER — MEPERIDINE HCL 25 MG/ML IJ SOLN: 6.2500 mg | INTRAMUSCULAR | Status: DC | PRN

## 2017-10-18 MED ORDER — OMEPRAZOLE 20 MG PO CPDR: 20.0000 mg | DELAYED_RELEASE_CAPSULE | Freq: Every day | ORAL | 0 refills | Status: AC

## 2017-10-18 MED ORDER — PHENYLEPHRINE 40 MCG/ML (10ML) SYRINGE FOR IV PUSH (FOR BLOOD PRESSURE SUPPORT)
PREFILLED_SYRINGE | INTRAVENOUS | Status: AC
  Filled 2017-10-18: qty 10

## 2017-10-18 MED ORDER — ROCURONIUM BROMIDE 100 MG/10ML IV SOLN
INTRAVENOUS | Status: DC | PRN
  Administered 2017-10-18: 50 mg via INTRAVENOUS

## 2017-10-18 MED ORDER — FENTANYL CITRATE (PF) 100 MCG/2ML IJ SOLN: 25.0000 ug | INTRAMUSCULAR | Status: DC | PRN

## 2017-10-18 MED ORDER — MIDAZOLAM HCL 2 MG/2ML IJ SOLN
INTRAMUSCULAR | Status: AC
  Filled 2017-10-18: qty 2

## 2017-10-18 MED ORDER — ONDANSETRON HCL 4 MG/2ML IJ SOLN
INTRAMUSCULAR | Status: AC
  Filled 2017-10-18: qty 2

## 2017-10-18 MED ORDER — PROMETHAZINE HCL 25 MG/ML IJ SOLN: 6.2500 mg | INTRAMUSCULAR | Status: DC | PRN

## 2017-10-18 MED ORDER — SUGAMMADEX SODIUM 200 MG/2ML IV SOLN
INTRAVENOUS | Status: DC | PRN
  Administered 2017-10-18: 200 mg via INTRAVENOUS

## 2017-10-18 MED ORDER — KETOROLAC TROMETHAMINE 30 MG/ML IJ SOLN: 30.0000 mg | Freq: Once | INTRAMUSCULAR | Status: DC | PRN

## 2017-10-18 MED ORDER — FENTANYL CITRATE (PF) 100 MCG/2ML IJ SOLN
INTRAMUSCULAR | Status: AC
  Filled 2017-10-18: qty 2

## 2017-10-18 MED ORDER — DEXAMETHASONE SODIUM PHOSPHATE 10 MG/ML IJ SOLN
INTRAMUSCULAR | Status: DC | PRN
  Administered 2017-10-18: 10 mg

## 2017-10-18 MED ORDER — FENTANYL CITRATE (PF) 100 MCG/2ML IJ SOLN
50.0000 ug | INTRAMUSCULAR | Status: DC | PRN
  Administered 2017-10-18: 50 ug via INTRAVENOUS

## 2017-10-18 MED ORDER — EPHEDRINE SULFATE-NACL 50-0.9 MG/10ML-% IV SOSY
PREFILLED_SYRINGE | INTRAVENOUS | Status: DC | PRN
  Administered 2017-10-18: 10 mg via INTRAVENOUS
  Administered 2017-10-18: 5 mg via INTRAVENOUS

## 2017-10-18 MED ORDER — SCOPOLAMINE 1 MG/3DAYS TD PT72: 1.0000 | MEDICATED_PATCH | Freq: Once | TRANSDERMAL | Status: DC | PRN

## 2017-10-18 MED ORDER — PHENYLEPHRINE HCL 10 MG/ML IJ SOLN
INTRAMUSCULAR | Status: AC
  Filled 2017-10-18: qty 2

## 2017-10-18 MED ORDER — ROPIVACAINE HCL 7.5 MG/ML IJ SOLN
INTRAMUSCULAR | Status: DC | PRN
  Administered 2017-10-18: 30 mL via PERINEURAL

## 2017-10-18 MED ORDER — DEXTROSE 5 % IV SOLN
INTRAVENOUS | Status: DC | PRN
  Administered 2017-10-18: 40 ug/min via INTRAVENOUS

## 2017-10-18 MED ORDER — EPHEDRINE 5 MG/ML INJ
INTRAVENOUS | Status: AC
  Filled 2017-10-18: qty 30

## 2017-10-18 MED ORDER — LACTATED RINGERS IV SOLN
INTRAVENOUS | Status: DC
  Administered 2017-10-18 (×2): via INTRAVENOUS

## 2017-10-18 MED ORDER — OXYCODONE HCL 5 MG/5ML PO SOLN: 5.0000 mg | Freq: Once | ORAL | Status: DC | PRN

## 2017-10-18 MED ORDER — SUGAMMADEX SODIUM 200 MG/2ML IV SOLN
INTRAVENOUS | Status: AC
  Filled 2017-10-18: qty 2

## 2017-10-18 MED ORDER — MIDAZOLAM HCL 2 MG/2ML IJ SOLN
1.0000 mg | INTRAMUSCULAR | Status: DC | PRN
  Administered 2017-10-18: 1 mg via INTRAVENOUS

## 2017-10-18 MED ORDER — DEXTROSE 5 % IV SOLN
3.0000 g | INTRAVENOUS | Status: AC
  Administered 2017-10-18: 3 g via INTRAVENOUS

## 2017-10-18 SURGICAL SUPPLY — 77 items
AID PSTN UNV HD RSTRNT DISP (MISCELLANEOUS) ×1
ANCH SUT 2 FBRWR FBRTK TGTL (Anchor) ×2 IMPLANT
ANCH SUT SWLK 19.1X4.75 (Anchor) ×3 IMPLANT
ANCHOR SUT BIO SW 4.75X19.1 (Anchor) ×6 IMPLANT
ANCHOR SUT FBRTK 2 TIGTAIL (Anchor) ×4 IMPLANT
APL SKNCLS STERI-STRIP NONHPOA (GAUZE/BANDAGES/DRESSINGS) ×1
BENZOIN TINCTURE PRP APPL 2/3 (GAUZE/BANDAGES/DRESSINGS) ×3 IMPLANT
BLADE EXCALIBUR 4.0X13 (MISCELLANEOUS) ×2 IMPLANT
BLADE SHAVER BONE 5.0X13 (MISCELLANEOUS) ×1 IMPLANT
BLADE SURG 10 STRL SS (BLADE) ×3 IMPLANT
BNDG COHESIVE 4X5 TAN STRL (GAUZE/BANDAGES/DRESSINGS) ×2 IMPLANT
BURR OVAL 8 FLU 4.0MM X 13CM (MISCELLANEOUS)
BURR OVAL 8 FLU 4.0X13 (MISCELLANEOUS) IMPLANT
CANNULA 5.75X71 LONG (CANNULA) IMPLANT
CANNULA PASSPORT BUTTON 10-40 (CANNULA) ×2 IMPLANT
CANNULA TWIST IN 8.25X7CM (CANNULA) IMPLANT
CHLORAPREP W/TINT 26ML (MISCELLANEOUS) ×3 IMPLANT
CLOSURE WOUND 1/2 X4 (GAUZE/BANDAGES/DRESSINGS)
DECANTER SPIKE VIAL GLASS SM (MISCELLANEOUS) IMPLANT
DISSECTOR 3.5MM X 13CM CVD (MISCELLANEOUS) ×2 IMPLANT
DISSECTOR 4.0MMX13CM CVD (MISCELLANEOUS) ×1 IMPLANT
DRAPE IMP U-DRAPE 54X76 (DRAPES) ×3 IMPLANT
DRAPE INCISE IOBAN 66X45 STRL (DRAPES) ×2 IMPLANT
DRAPE STERI 35X30 U-POUCH (DRAPES) ×3 IMPLANT
DRAPE U-SHAPE 76X120 STRL (DRAPES) ×3 IMPLANT
DRSG PAD ABDOMINAL 8X10 ST (GAUZE/BANDAGES/DRESSINGS) ×3 IMPLANT
ELECT NDL TIP 2.8 STRL (NEEDLE) IMPLANT
ELECT NEEDLE TIP 2.8 STRL (NEEDLE) IMPLANT
ELECT REM PT RETURN 9FT ADLT (ELECTROSURGICAL) ×3
ELECTRODE REM PT RTRN 9FT ADLT (ELECTROSURGICAL) ×1 IMPLANT
GAUZE SPONGE 4X4 12PLY STRL (GAUZE/BANDAGES/DRESSINGS) ×3 IMPLANT
GAUZE XEROFORM 1X8 LF (GAUZE/BANDAGES/DRESSINGS) IMPLANT
GLOVE BIOGEL PI IND STRL 8 (GLOVE) ×1 IMPLANT
GLOVE BIOGEL PI INDICATOR 8 (GLOVE) ×2
GLOVE ECLIPSE 6.5 STRL STRAW (GLOVE) ×4 IMPLANT
GLOVE ECLIPSE 8.0 STRL XLNG CF (GLOVE) ×3 IMPLANT
GOWN STRL REUS W/ TWL LRG LVL3 (GOWN DISPOSABLE) ×2 IMPLANT
GOWN STRL REUS W/TWL LRG LVL3 (GOWN DISPOSABLE) ×6
GOWN STRL REUS W/TWL XL LVL3 (GOWN DISPOSABLE) ×1 IMPLANT
KIT PUSHLOCK 2.9 HIP (KITS) IMPLANT
KIT SPEAR STR 1.6MM DRILL (MISCELLANEOUS) ×2 IMPLANT
KIT STABILIZATION SHOULDER (MISCELLANEOUS) ×3 IMPLANT
LASSO 90 CVE QUICKPAS (DISPOSABLE) IMPLANT
MANIFOLD NEPTUNE II (INSTRUMENTS) ×3 IMPLANT
NDL SCORPION MULTI FIRE (NEEDLE) IMPLANT
NDL SUT 6 .5 CRC .975X.05 MAYO (NEEDLE) IMPLANT
NEEDLE MAYO TAPER (NEEDLE)
NEEDLE SCORPION MULTI FIRE (NEEDLE) IMPLANT
PACK ARTHROSCOPY DSU (CUSTOM PROCEDURE TRAY) ×3 IMPLANT
PACK BASIN DAY SURGERY FS (CUSTOM PROCEDURE TRAY) ×3 IMPLANT
PENCIL BUTTON HOLSTER BLD 10FT (ELECTRODE) IMPLANT
PROBE BIPOLAR ATHRO 135MM 90D (MISCELLANEOUS) ×3 IMPLANT
RESTRAINT HEAD UNIVERSAL NS (MISCELLANEOUS) ×3 IMPLANT
SHEET MEDIUM DRAPE 40X70 STRL (DRAPES) ×2 IMPLANT
SLEEVE SCD COMPRESS KNEE MED (MISCELLANEOUS) ×3 IMPLANT
SLING ARM FOAM STRAP LRG (SOFTGOODS) IMPLANT
SLING ARM IMMOBILIZER LRG (SOFTGOODS) IMPLANT
SLING ARM IMMOBILIZER MED (SOFTGOODS) IMPLANT
SLING ARM MED ADULT FOAM STRAP (SOFTGOODS) IMPLANT
SLING ARM XL FOAM STRAP (SOFTGOODS) IMPLANT
SPONGE LAP 4X18 X RAY DECT (DISPOSABLE) ×2 IMPLANT
STRIP CLOSURE SKIN 1/2X4 (GAUZE/BANDAGES/DRESSINGS) IMPLANT
SUCTION FRAZIER HANDLE 10FR (MISCELLANEOUS)
SUCTION TUBE FRAZIER 10FR DISP (MISCELLANEOUS) IMPLANT
SUT ETHILON 3 0 PS 1 (SUTURE) IMPLANT
SUT FIBERWIRE #2 38 T-5 BLUE (SUTURE)
SUT TIGER TAPE 7 IN WHITE (SUTURE) IMPLANT
SUTURE FIBERWR #2 38 T-5 BLUE (SUTURE) IMPLANT
SUTURE TAPE TIGERLINK 1.3MM BL (SUTURE) IMPLANT
SUTURETAPE TIGERLINK 1.3MM BL (SUTURE) ×3
TAPE FIBER 2MM 7IN #2 BLUE (SUTURE) IMPLANT
TOWEL OR 17X24 6PK STRL BLUE (TOWEL DISPOSABLE) ×3 IMPLANT
TOWEL OR NON WOVEN STRL DISP B (DISPOSABLE) ×3 IMPLANT
TUBE CONNECTING 20'X1/4 (TUBING) ×2
TUBE CONNECTING 20X1/4 (TUBING) ×3 IMPLANT
TUBE SUCTION HIGH CAP CLEAR NV (SUCTIONS) ×2 IMPLANT
TUBING ARTHROSCOPY IRRIG 16FT (MISCELLANEOUS) ×3 IMPLANT

## 2017-10-18 NOTE — Anesthesia Procedure Notes (Signed)
Anesthesia Regional Block: Interscalene brachial plexus block   Pre-Anesthetic Checklist: ,, timeout performed, Correct Patient, Correct Site, Correct Laterality, Correct Procedure, Correct Position, site marked, Risks and benefits discussed,  Surgical consent,  Pre-op evaluation,  At surgeon's request and post-op pain management  Laterality: Left  Prep: chloraprep       Needles:  Injection technique: Single-shot  Needle Type: Stimulator Needle - 40     Needle Length: 4cm  Needle Gauge: 22     Additional Needles:   Procedures:,,,, ultrasound used (permanent image in chart),,,,  Narrative:  Start time: 10/18/2017 7:53 AM End time: 10/18/2017 7:56 AM Injection made incrementally with aspirations every 5 mL.  Performed by: Personally  Anesthesiologist: Nolon Nations, MD  Additional Notes: BP cuff, EKG monitors applied. Sedation begun. Nerve location verified with U/S. Anesthetic injected incrementally, slowly , and after neg aspirations under direct u/s guidance. Good perineural spread. Tolerated well.

## 2017-10-18 NOTE — Anesthesia Postprocedure Evaluation (Signed)
Anesthesia Post Note  Patient: Christian Hart  Procedure(s) Performed: LEFT SHOULDER ARTHROSCOPY, DEBRIDEMENT WITH SUBACROMIAL DECOMPRESSION, ROTATOR CUFF REPAIR AND BICEP TENODESIS (Left Shoulder)     Patient location during evaluation: PACU Anesthesia Type: General Level of consciousness: sedated and patient cooperative Pain management: pain level controlled Vital Signs Assessment: post-procedure vital signs reviewed and stable Respiratory status: spontaneous breathing Cardiovascular status: stable Anesthetic complications: no    Last Vitals:  Vitals:   10/18/17 1130 10/18/17 1200  BP: 125/85 (!) 149/91  Pulse: (!) 104 (!) 110  Resp: 19 18  Temp:  36.5 C  SpO2: 97% 97%    Last Pain:  Vitals:   10/18/17 1200  TempSrc:   PainSc: 0-No pain                 Nolon Nations

## 2017-10-18 NOTE — Anesthesia Preprocedure Evaluation (Signed)
Anesthesia Evaluation  Patient identified by MRN, date of birth, ID band Patient awake    Reviewed: Allergy & Precautions, NPO status , Patient's Chart, lab work & pertinent test results  Airway Mallampati: II  TM Distance: >3 FB Neck ROM: Full    Dental no notable dental hx.    Pulmonary neg pulmonary ROS,    Pulmonary exam normal breath sounds clear to auscultation       Cardiovascular hypertension, Pt. on medications Normal cardiovascular exam Rhythm:Regular Rate:Normal     Neuro/Psych negative neurological ROS  negative psych ROS   GI/Hepatic negative GI ROS, Neg liver ROS,   Endo/Other  negative endocrine ROS  Renal/GU negative Renal ROS     Musculoskeletal negative musculoskeletal ROS (+)   Abdominal (+) + obese,   Peds  Hematology negative hematology ROS (+)   Anesthesia Other Findings   Reproductive/Obstetrics                             Anesthesia Physical Anesthesia Plan  ASA: II  Anesthesia Plan: General   Post-op Pain Management: GA combined w/ Regional for post-op pain   Induction: Intravenous  PONV Risk Score and Plan: 2 and Ondansetron and Dexamethasone  Airway Management Planned: Oral ETT  Additional Equipment:   Intra-op Plan:   Post-operative Plan: Extubation in OR  Informed Consent: I have reviewed the patients History and Physical, chart, labs and discussed the procedure including the risks, benefits and alternatives for the proposed anesthesia with the patient or authorized representative who has indicated his/her understanding and acceptance.   Dental advisory given  Plan Discussed with: CRNA  Anesthesia Plan Comments:         Anesthesia Quick Evaluation

## 2017-10-18 NOTE — Anesthesia Procedure Notes (Signed)
Procedure Name: Intubation Date/Time: 10/18/2017 8:52 AM Performed by: Gwyndolyn Saxon, CRNA Pre-anesthesia Checklist: Patient identified, Emergency Drugs available, Suction available, Patient being monitored and Timeout performed Patient Re-evaluated:Patient Re-evaluated prior to induction Oxygen Delivery Method: Circle system utilized Preoxygenation: Pre-oxygenation with 100% oxygen Induction Type: IV induction Ventilation: Mask ventilation without difficulty Laryngoscope Size: Miller and 2 Grade View: Grade II Tube type: Oral Tube size: 8.0 mm Number of attempts: 1 Placement Confirmation: ETT inserted through vocal cords under direct vision,  positive ETCO2,  CO2 detector and breath sounds checked- equal and bilateral Secured at: 23 cm Tube secured with: Tape Dental Injury: Teeth and Oropharynx as per pre-operative assessment

## 2017-10-18 NOTE — Progress Notes (Signed)
Assisted Dr. Germeroth with left, ultrasound guided, interscalene  block. Side rails up, monitors on throughout procedure. See vital signs in flow sheet. Tolerated Procedure well. 

## 2017-10-18 NOTE — Op Note (Signed)
Orthopaedic Surgery Operative Note (CSN: [id number])  Christian Hart  1946-06-15 Date of Surgery: 10/18/2017   Diagnoses:  Left supraspinatus tear, impingement, biceps fraying  Left shoulder arthroscopy with debridement extensive 29823 Left Shoulder rotator cuff repair 29827 Left biceps tenodesis suprapec 29828 Left acromioplasty 29826   Operative Finding Successful completion of planned procedure.  Good double row repair with good tendon quality and bone quality.  Biceps tenodesis with great tension. Exam under anesthesia: full motion, no limitation Articular space: No loose bodies, capsule intact, anterior labral fraying with early grade 3 changes isolated with a delaminating layer of cartilage anteriorly in a inferior location. Chondral surfaces:humeral head intact, glenoid as above Biceps: significant tearing and frayin Subscapularis: some minor fraying anteriorly, tendon insertion intact Superior Cuff:near full thickness tear noted from bursal side, articular side small full thickness area. Bursal side: medium tear, small full thickness pinhole type area, released and full repair performed.  Post-operative plan: The patient will be NWB in sling.  The patient will be dc home.  DVT prophylaxis not indicated in isolated upper extremity surgery patient with no specific risks factors.  Pain control with PRN pain medication preferring oral medicines.  Follow up plan will be scheduled in approximately 10-14 days for wound check and Outlet xr.  Post-Op Diagnosis: Same Surgeons:Primary: Hiram Gash, MD Assistants:None Location: [place] OR ROOM 6 Anesthesia: General Antibiotics: Ancef 2g preop Tourniquet time: * No tourniquets in log * Estimated Blood Loss: minimal Complications: None Specimens: None Implants: Implant Name Type Inv. Item Serial No. Manufacturer Lot No. LRB No. Used Action  ANCHOR SUT BIO SW 4.75X19.1 - [id number] Anchor ANCHOR SUT BIO SW 4.75X19.1  [employer] [id number] Left  1 Implanted  [facility] SUTURE - [id number] Anchor [employer] SUTURE  [place] [id number] Left 1 Implanted  [facility] SUTURE - [id number] Anchor ANCHOR FIBERTAK SUTURE  [place] [id number] Left 1 Implanted  ANCHOR SUT BIO SW 4.75X19.1 - [id number] Anchor ANCHOR SUT BIO SW 4.75X19.1  [employer] [id number] Left 1 Implanted  ANCHOR SUT BIO SW 4.75X19.1 - [id number] Anchor ANCHOR SUT BIO SW 4.75X19.1  [place] [id number] Left 1 Implanted    Indications for Surgery:   Christian Hart is a 72 y.o. male with 1 year of left shoulder pain and MRI demonstrating full thickness cuff tear failing non-op measures.  Benefits and risks of operative and nonoperative management were discussed prior to surgery with patient/guardian(s) and informed consent form was completed.  Specific risks including infection, need for additional surgery, retear, continued pain, arthrosis, continued pain   Procedure:    Patient was correctly identified in the preoperative holding area and operative site marked.  Patient brought to OR and positioned beachchair on an [place] table ensuring that all bony prominences were padded and the head was in an appropriate location.  Anesthesia was induced and the operative shoulder was prepped and draped in the usual sterile fashion.  Timeout was called preincision.  A standard posterior viewing portal was made after localizing the portal with a spinal needle.  An anterior accessory portal was also made.  After clearing the articular space the camera was positioned in the subacromial space.  Findings above.  Debridement of the anterior undermined articular cartilage, labrum and biceps stump and biceps released performed with shaver.  Subacromial decompression: We made a lateral portal with spinal needle guidance. We then proceeded to debride bursal tissue extensively with a shaver and arthrocare device. At that point we continued to identify the  borders of the acromion and identify the  spur. We then carefully preserved the deltoid fascia and used a burr to convert the type 2 acromion to a Type 1 flat acromion without issue.  Biceps tenodesis: We marked the tendon and then performed a tenotomy and debridement of the stump in the articular space. We then identified the biceps tendon in its groove suprapec with the arthroscope in the lateral portal taking care to move from lateral to medial to avoid injury to the subscapularis. At that point we unroofed the tendon itself and mobilized it. An accessory anterior portal was made in line with the tendon and we grasped it from the anterior superior portal and worked from the accessory anterior portal. Two Fibertak 1.42mm anchors were placed in the groove and the tendon was secured in a luggage loop style fashion with good tension on the tendon.  Arthroscopic Rotator Cuff Repair: Tuberosity was prepared with a burr to a bleeding bed.  Following completion of the above we placed 1 4.7 Swivelock anchor loaded with a tape at inserted at the medial articular margin and an scorpion suture passing device, shuttled  utures medially in a horizontal mattress suture configuration.  We then tied using arthroscopic knot tying techniques  each suture to its partner reducing the tendon at the prepared insertion site.  The fiber tape was not tied. With a medial row suture limbs then incorporated, 2 anteriorly and 2 posteriorly, into each of two 4.75 PEEK SwiveLock anchors, each placed 8 to 10 mm below the tip of the tuberosity and spanning anterior-posterior width of the tear with care to avoid over tensioning.   The incisions were closed with absorbable monocryl, benzoin and steri strips.  A sterile dressing was placed along with a sling. The patient was awoken from general anesthesia and taken to the PACU in stable condition without complication.

## 2017-10-18 NOTE — Transfer of Care (Signed)
Immediate Anesthesia Transfer of Care Note  Patient: Christian Hart  Procedure(s) Performed: LEFT SHOULDER ARTHROSCOPY, DEBRIDEMENT WITH SUBACROMIAL DECOMPRESSION, ROTATOR CUFF REPAIR AND BICEP TENODESIS (Left Shoulder)  Patient Location: PACU  Anesthesia Type:General  Level of Consciousness: awake and alert   Airway & Oxygen Therapy: Patient Spontanous Breathing and Patient connected to face mask oxygen  Post-op Assessment: Report given to RN and Post -op Vital signs reviewed and stable  Post vital signs: Reviewed and stable  Last Vitals:  Vitals:   10/18/17 0830 10/18/17 1051  BP: 129/77 (!) 157/82  Pulse: 91 (!) 104  Resp: 19 19  Temp:    SpO2: 94% 94%    Last Pain:  Vitals:   10/18/17 1051  TempSrc:   PainSc: 0-No pain         Complications: No apparent anesthesia complications

## 2017-10-18 NOTE — Discharge Instructions (Signed)
Regional Anesthesia Blocks ? ?1. Numbness or the inability to move the "blocked" extremity may last from 3-48 hours after placement. The length of time depends on the medication injected and your individual response to the medication. If the numbness is not going away after 48 hours, call your surgeon. ? ?2. The extremity that is blocked will need to be protected until the numbness is gone and the  Strength has returned. Because you cannot feel it, you will need to take extra care to avoid injury. Because it may be weak, you may have difficulty moving it or using it. You may not know what position it is in without looking at it while the block is in effect. ? ?3. For blocks in the legs and feet, returning to weight bearing and walking needs to be done carefully. You will need to wait until the numbness is entirely gone and the strength has returned. You should be able to move your leg and foot normally before you try and bear weight or walk. You will need someone to be with you when you first try to ensure you do not fall and possibly risk injury. ? ?4. Bruising and tenderness at the needle site are common side effects and will resolve in a few days. ? ?5. Persistent numbness or new problems with movement should be communicated to the surgeon or the [facility] ([phone])/ [facility] ([phone]).  ? ?Post Anesthesia Home Care Instructions ? ?Activity: ?Get plenty of rest for the remainder of the day. A responsible individual must stay with you for 24 hours following the procedure.  ?For the next 24 hours, DO NOT: ?-Drive a car ?-Operate machinery ?-Drink alcoholic beverages ?-Take any medication unless instructed by your physician ?-Make any legal decisions or sign important papers. ? ?Meals: ?Start with liquid foods such as gelatin or soup. Progress to regular foods as tolerated. Avoid greasy, spicy, heavy foods. If nausea and/or vomiting occur, drink only clear liquids until the  nausea and/or vomiting subsides. Call your physician if vomiting continues. ? ?Special Instructions/Symptoms: ?Your throat may feel dry or sore from the anesthesia or the breathing tube placed in your throat during surgery. If this causes discomfort, gargle with warm salt water. The discomfort should disappear within 24 hours. ? ?If you had a scopolamine patch placed behind your ear for the management of post- operative nausea and/or vomiting: ? ?1. The medication in the patch is effective for 72 hours, after which it should be removed.  Wrap patch in a tissue and discard in the trash. Wash hands thoroughly with soap and water. ?2. You may remove the patch earlier than 72 hours if you experience unpleasant side effects which may include dry mouth, dizziness or visual disturbances. ?3. Avoid touching the patch. Wash your hands with soap and water after contact with the patch. ?    ?

## 2017-10-19 NOTE — Addendum Note (Signed)
Addendum  created 10/19/17 5909 by Tawni Millers, CRNA   Charge Capture section accepted

## 2017-10-22 ENCOUNTER — Encounter (HOSPITAL_BASED_OUTPATIENT_CLINIC_OR_DEPARTMENT_OTHER): Payer: Self-pay | Admitting: Orthopaedic Surgery

## 2017-10-26 DIAGNOSIS — M24112 Other articular cartilage disorders, left shoulder: Secondary | ICD-10-CM | POA: Diagnosis not present

## 2017-11-01 DIAGNOSIS — S46012D Strain of muscle(s) and tendon(s) of the rotator cuff of left shoulder, subsequent encounter: Secondary | ICD-10-CM | POA: Diagnosis not present

## 2017-11-01 DIAGNOSIS — M25512 Pain in left shoulder: Secondary | ICD-10-CM | POA: Diagnosis not present

## 2017-11-01 DIAGNOSIS — M25612 Stiffness of left shoulder, not elsewhere classified: Secondary | ICD-10-CM | POA: Diagnosis not present

## 2017-11-01 DIAGNOSIS — M6281 Muscle weakness (generalized): Secondary | ICD-10-CM | POA: Diagnosis not present

## 2017-11-06 DIAGNOSIS — M25612 Stiffness of left shoulder, not elsewhere classified: Secondary | ICD-10-CM | POA: Diagnosis not present

## 2017-11-06 DIAGNOSIS — M6281 Muscle weakness (generalized): Secondary | ICD-10-CM | POA: Diagnosis not present

## 2017-11-06 DIAGNOSIS — S46012D Strain of muscle(s) and tendon(s) of the rotator cuff of left shoulder, subsequent encounter: Secondary | ICD-10-CM | POA: Diagnosis not present

## 2017-11-06 DIAGNOSIS — M25512 Pain in left shoulder: Secondary | ICD-10-CM | POA: Diagnosis not present

## 2017-11-08 DIAGNOSIS — S43012D Anterior subluxation of left humerus, subsequent encounter: Secondary | ICD-10-CM | POA: Diagnosis not present

## 2017-11-08 DIAGNOSIS — M25612 Stiffness of left shoulder, not elsewhere classified: Secondary | ICD-10-CM | POA: Diagnosis not present

## 2017-11-08 DIAGNOSIS — M25512 Pain in left shoulder: Secondary | ICD-10-CM | POA: Diagnosis not present

## 2017-11-08 DIAGNOSIS — M6281 Muscle weakness (generalized): Secondary | ICD-10-CM | POA: Diagnosis not present

## 2017-11-13 DIAGNOSIS — M25612 Stiffness of left shoulder, not elsewhere classified: Secondary | ICD-10-CM | POA: Diagnosis not present

## 2017-11-13 DIAGNOSIS — M25512 Pain in left shoulder: Secondary | ICD-10-CM | POA: Diagnosis not present

## 2017-11-13 DIAGNOSIS — S46012D Strain of muscle(s) and tendon(s) of the rotator cuff of left shoulder, subsequent encounter: Secondary | ICD-10-CM | POA: Diagnosis not present

## 2017-11-13 DIAGNOSIS — M6281 Muscle weakness (generalized): Secondary | ICD-10-CM | POA: Diagnosis not present

## 2017-11-15 DIAGNOSIS — M25612 Stiffness of left shoulder, not elsewhere classified: Secondary | ICD-10-CM | POA: Diagnosis not present

## 2017-11-15 DIAGNOSIS — S46012D Strain of muscle(s) and tendon(s) of the rotator cuff of left shoulder, subsequent encounter: Secondary | ICD-10-CM | POA: Diagnosis not present

## 2017-11-15 DIAGNOSIS — M25512 Pain in left shoulder: Secondary | ICD-10-CM | POA: Diagnosis not present

## 2017-11-15 DIAGNOSIS — M6281 Muscle weakness (generalized): Secondary | ICD-10-CM | POA: Diagnosis not present

## 2017-11-22 DIAGNOSIS — M25612 Stiffness of left shoulder, not elsewhere classified: Secondary | ICD-10-CM | POA: Diagnosis not present

## 2017-11-22 DIAGNOSIS — M6281 Muscle weakness (generalized): Secondary | ICD-10-CM | POA: Diagnosis not present

## 2017-11-22 DIAGNOSIS — S46012D Strain of muscle(s) and tendon(s) of the rotator cuff of left shoulder, subsequent encounter: Secondary | ICD-10-CM | POA: Diagnosis not present

## 2017-11-22 DIAGNOSIS — M25512 Pain in left shoulder: Secondary | ICD-10-CM | POA: Diagnosis not present

## 2017-11-29 DIAGNOSIS — M25512 Pain in left shoulder: Secondary | ICD-10-CM | POA: Diagnosis not present

## 2017-11-29 DIAGNOSIS — M6281 Muscle weakness (generalized): Secondary | ICD-10-CM | POA: Diagnosis not present

## 2017-11-29 DIAGNOSIS — M25612 Stiffness of left shoulder, not elsewhere classified: Secondary | ICD-10-CM | POA: Diagnosis not present

## 2017-11-29 DIAGNOSIS — S46012D Strain of muscle(s) and tendon(s) of the rotator cuff of left shoulder, subsequent encounter: Secondary | ICD-10-CM | POA: Diagnosis not present

## 2017-12-03 DIAGNOSIS — M25612 Stiffness of left shoulder, not elsewhere classified: Secondary | ICD-10-CM | POA: Diagnosis not present

## 2017-12-03 DIAGNOSIS — S46012D Strain of muscle(s) and tendon(s) of the rotator cuff of left shoulder, subsequent encounter: Secondary | ICD-10-CM | POA: Diagnosis not present

## 2017-12-03 DIAGNOSIS — M25512 Pain in left shoulder: Secondary | ICD-10-CM | POA: Diagnosis not present

## 2017-12-03 DIAGNOSIS — M6281 Muscle weakness (generalized): Secondary | ICD-10-CM | POA: Diagnosis not present

## 2017-12-04 DIAGNOSIS — M25512 Pain in left shoulder: Secondary | ICD-10-CM | POA: Diagnosis not present

## 2017-12-05 DIAGNOSIS — M6281 Muscle weakness (generalized): Secondary | ICD-10-CM | POA: Diagnosis not present

## 2017-12-05 DIAGNOSIS — S46012D Strain of muscle(s) and tendon(s) of the rotator cuff of left shoulder, subsequent encounter: Secondary | ICD-10-CM | POA: Diagnosis not present

## 2017-12-05 DIAGNOSIS — M25612 Stiffness of left shoulder, not elsewhere classified: Secondary | ICD-10-CM | POA: Diagnosis not present

## 2017-12-05 DIAGNOSIS — M25512 Pain in left shoulder: Secondary | ICD-10-CM | POA: Diagnosis not present

## 2017-12-11 DIAGNOSIS — S46012D Strain of muscle(s) and tendon(s) of the rotator cuff of left shoulder, subsequent encounter: Secondary | ICD-10-CM | POA: Diagnosis not present

## 2017-12-11 DIAGNOSIS — M25512 Pain in left shoulder: Secondary | ICD-10-CM | POA: Diagnosis not present

## 2017-12-11 DIAGNOSIS — M6281 Muscle weakness (generalized): Secondary | ICD-10-CM | POA: Diagnosis not present

## 2017-12-11 DIAGNOSIS — M25612 Stiffness of left shoulder, not elsewhere classified: Secondary | ICD-10-CM | POA: Diagnosis not present

## 2017-12-13 DIAGNOSIS — M25612 Stiffness of left shoulder, not elsewhere classified: Secondary | ICD-10-CM | POA: Diagnosis not present

## 2017-12-13 DIAGNOSIS — S46012D Strain of muscle(s) and tendon(s) of the rotator cuff of left shoulder, subsequent encounter: Secondary | ICD-10-CM | POA: Diagnosis not present

## 2017-12-13 DIAGNOSIS — M6281 Muscle weakness (generalized): Secondary | ICD-10-CM | POA: Diagnosis not present

## 2017-12-13 DIAGNOSIS — M25512 Pain in left shoulder: Secondary | ICD-10-CM | POA: Diagnosis not present

## 2017-12-18 DIAGNOSIS — M25512 Pain in left shoulder: Secondary | ICD-10-CM | POA: Diagnosis not present

## 2017-12-18 DIAGNOSIS — M6281 Muscle weakness (generalized): Secondary | ICD-10-CM | POA: Diagnosis not present

## 2017-12-18 DIAGNOSIS — M25612 Stiffness of left shoulder, not elsewhere classified: Secondary | ICD-10-CM | POA: Diagnosis not present

## 2017-12-18 DIAGNOSIS — S46012D Strain of muscle(s) and tendon(s) of the rotator cuff of left shoulder, subsequent encounter: Secondary | ICD-10-CM | POA: Diagnosis not present

## 2017-12-25 DIAGNOSIS — M6281 Muscle weakness (generalized): Secondary | ICD-10-CM | POA: Diagnosis not present

## 2017-12-25 DIAGNOSIS — M25612 Stiffness of left shoulder, not elsewhere classified: Secondary | ICD-10-CM | POA: Diagnosis not present

## 2017-12-25 DIAGNOSIS — S46012D Strain of muscle(s) and tendon(s) of the rotator cuff of left shoulder, subsequent encounter: Secondary | ICD-10-CM | POA: Diagnosis not present

## 2017-12-25 DIAGNOSIS — M25512 Pain in left shoulder: Secondary | ICD-10-CM | POA: Diagnosis not present

## 2017-12-27 DIAGNOSIS — M6281 Muscle weakness (generalized): Secondary | ICD-10-CM | POA: Diagnosis not present

## 2017-12-27 DIAGNOSIS — S46012D Strain of muscle(s) and tendon(s) of the rotator cuff of left shoulder, subsequent encounter: Secondary | ICD-10-CM | POA: Diagnosis not present

## 2017-12-27 DIAGNOSIS — M25512 Pain in left shoulder: Secondary | ICD-10-CM | POA: Diagnosis not present

## 2017-12-27 DIAGNOSIS — M25612 Stiffness of left shoulder, not elsewhere classified: Secondary | ICD-10-CM | POA: Diagnosis not present

## 2018-01-01 DIAGNOSIS — M25612 Stiffness of left shoulder, not elsewhere classified: Secondary | ICD-10-CM | POA: Diagnosis not present

## 2018-01-01 DIAGNOSIS — M25512 Pain in left shoulder: Secondary | ICD-10-CM | POA: Diagnosis not present

## 2018-01-01 DIAGNOSIS — M6281 Muscle weakness (generalized): Secondary | ICD-10-CM | POA: Diagnosis not present

## 2018-01-01 DIAGNOSIS — S46012D Strain of muscle(s) and tendon(s) of the rotator cuff of left shoulder, subsequent encounter: Secondary | ICD-10-CM | POA: Diagnosis not present

## 2018-01-03 DIAGNOSIS — M25612 Stiffness of left shoulder, not elsewhere classified: Secondary | ICD-10-CM | POA: Diagnosis not present

## 2018-01-03 DIAGNOSIS — S46012D Strain of muscle(s) and tendon(s) of the rotator cuff of left shoulder, subsequent encounter: Secondary | ICD-10-CM | POA: Diagnosis not present

## 2018-01-03 DIAGNOSIS — M6281 Muscle weakness (generalized): Secondary | ICD-10-CM | POA: Diagnosis not present

## 2018-01-03 DIAGNOSIS — M25512 Pain in left shoulder: Secondary | ICD-10-CM | POA: Diagnosis not present

## 2018-01-10 DIAGNOSIS — M25612 Stiffness of left shoulder, not elsewhere classified: Secondary | ICD-10-CM | POA: Diagnosis not present

## 2018-01-10 DIAGNOSIS — M25512 Pain in left shoulder: Secondary | ICD-10-CM | POA: Diagnosis not present

## 2018-01-10 DIAGNOSIS — S46012D Strain of muscle(s) and tendon(s) of the rotator cuff of left shoulder, subsequent encounter: Secondary | ICD-10-CM | POA: Diagnosis not present

## 2018-01-10 DIAGNOSIS — M6281 Muscle weakness (generalized): Secondary | ICD-10-CM | POA: Diagnosis not present

## 2018-01-14 DIAGNOSIS — M6281 Muscle weakness (generalized): Secondary | ICD-10-CM | POA: Diagnosis not present

## 2018-01-14 DIAGNOSIS — M25512 Pain in left shoulder: Secondary | ICD-10-CM | POA: Diagnosis not present

## 2018-01-14 DIAGNOSIS — M25612 Stiffness of left shoulder, not elsewhere classified: Secondary | ICD-10-CM | POA: Diagnosis not present

## 2018-01-14 DIAGNOSIS — S46012D Strain of muscle(s) and tendon(s) of the rotator cuff of left shoulder, subsequent encounter: Secondary | ICD-10-CM | POA: Diagnosis not present

## 2018-01-15 DIAGNOSIS — M25512 Pain in left shoulder: Secondary | ICD-10-CM | POA: Diagnosis not present

## 2018-01-17 DIAGNOSIS — M25512 Pain in left shoulder: Secondary | ICD-10-CM | POA: Diagnosis not present

## 2018-01-17 DIAGNOSIS — M25612 Stiffness of left shoulder, not elsewhere classified: Secondary | ICD-10-CM | POA: Diagnosis not present

## 2018-01-17 DIAGNOSIS — M6281 Muscle weakness (generalized): Secondary | ICD-10-CM | POA: Diagnosis not present

## 2018-01-17 DIAGNOSIS — S46012D Strain of muscle(s) and tendon(s) of the rotator cuff of left shoulder, subsequent encounter: Secondary | ICD-10-CM | POA: Diagnosis not present

## 2018-02-11 DIAGNOSIS — E782 Mixed hyperlipidemia: Secondary | ICD-10-CM | POA: Diagnosis not present

## 2018-02-11 DIAGNOSIS — F411 Generalized anxiety disorder: Secondary | ICD-10-CM | POA: Diagnosis not present

## 2018-02-11 DIAGNOSIS — Z0000 Encounter for general adult medical examination without abnormal findings: Secondary | ICD-10-CM | POA: Diagnosis not present

## 2018-02-11 DIAGNOSIS — K5730 Diverticulosis of large intestine without perforation or abscess without bleeding: Secondary | ICD-10-CM | POA: Diagnosis not present

## 2018-02-11 DIAGNOSIS — Z6836 Body mass index (BMI) 36.0-36.9, adult: Secondary | ICD-10-CM | POA: Diagnosis not present

## 2018-02-11 DIAGNOSIS — M109 Gout, unspecified: Secondary | ICD-10-CM | POA: Diagnosis not present

## 2018-02-11 DIAGNOSIS — Z125 Encounter for screening for malignant neoplasm of prostate: Secondary | ICD-10-CM | POA: Diagnosis not present

## 2018-02-11 DIAGNOSIS — R7301 Impaired fasting glucose: Secondary | ICD-10-CM | POA: Diagnosis not present

## 2018-02-11 DIAGNOSIS — E669 Obesity, unspecified: Secondary | ICD-10-CM | POA: Diagnosis not present

## 2018-02-11 DIAGNOSIS — I8310 Varicose veins of unspecified lower extremity with inflammation: Secondary | ICD-10-CM | POA: Diagnosis not present

## 2018-02-11 DIAGNOSIS — N529 Male erectile dysfunction, unspecified: Secondary | ICD-10-CM | POA: Diagnosis not present

## 2018-02-11 DIAGNOSIS — I10 Essential (primary) hypertension: Secondary | ICD-10-CM | POA: Diagnosis not present

## 2018-03-04 DIAGNOSIS — H40003 Preglaucoma, unspecified, bilateral: Secondary | ICD-10-CM | POA: Diagnosis not present

## 2018-03-18 DIAGNOSIS — H40003 Preglaucoma, unspecified, bilateral: Secondary | ICD-10-CM | POA: Diagnosis not present

## 2018-06-25 DIAGNOSIS — E7800 Pure hypercholesterolemia, unspecified: Secondary | ICD-10-CM | POA: Diagnosis not present

## 2018-06-25 DIAGNOSIS — I10 Essential (primary) hypertension: Secondary | ICD-10-CM | POA: Diagnosis not present

## 2018-06-25 DIAGNOSIS — E1169 Type 2 diabetes mellitus with other specified complication: Secondary | ICD-10-CM | POA: Diagnosis not present

## 2018-11-03 IMAGING — MR MR SHOULDER*L* W/O CM
4 of 5 series · 14 of 40 positions shown · non-contrast
Comparison: None.

CLINICAL DATA: Chronic left shoulder pain for the past year. No
injury.

EXAM:
MRI OF THE LEFT SHOULDER WITHOUT CONTRAST
TECHNIQUE: Multiplanar, multisequence MR imaging of the shoulder was performed.
No intravenous contrast was administered.

[Series 6: T2 fat-sat · axial · left · 3.0mm · 0.44mm/px · z∈[-58,+9]mm · 3 of 27 slices shown (1 of 3)]
[im 4/27]
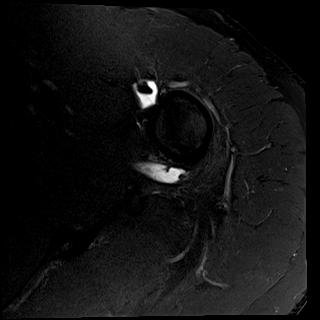
[im 15/27]
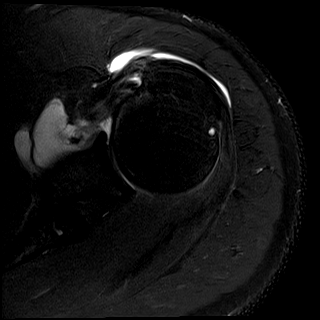
[im 23/27]
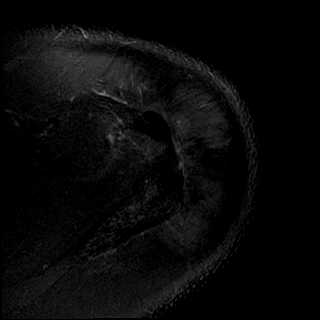

[Series 7: T2 fat-sat · oblique · left · 3.0mm · 0.44mm/px · 3 of 27 slices shown (2 of 3)]
[im 4/27]
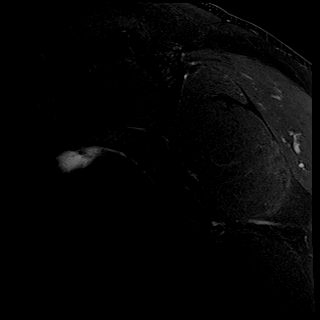
[im 14/27]
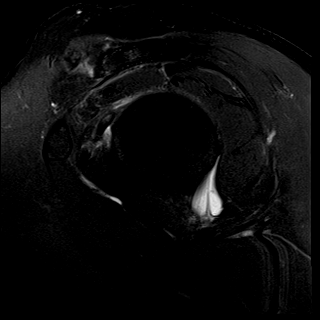
[im 23/27]
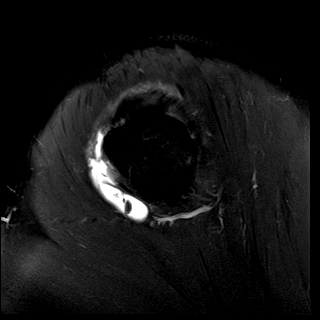

[Series 9: PD · oblique · left · 3.0mm · 0.18mm/px · 5 of 23 slices shown]
[im 1/23]
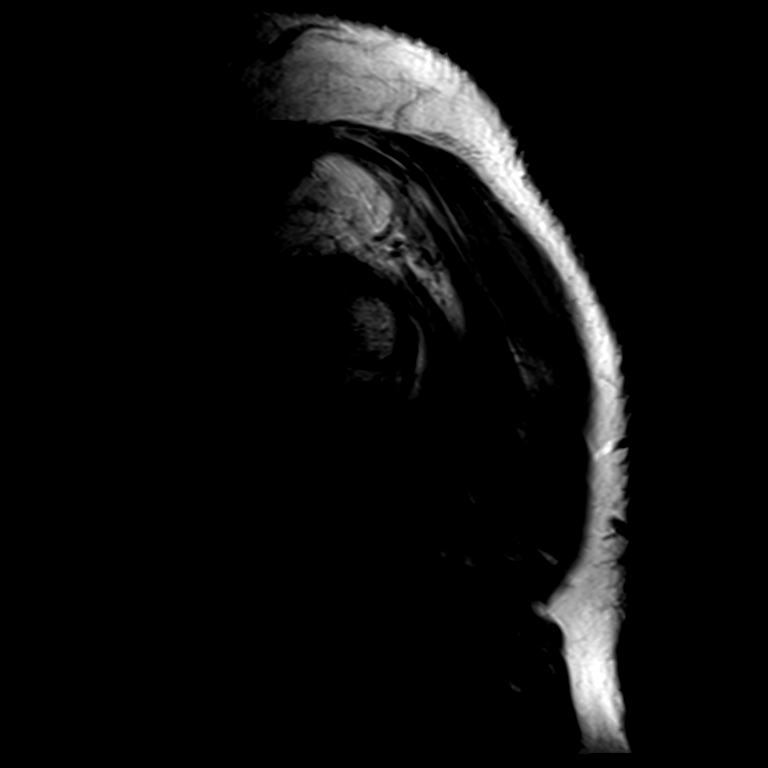
[im 4/23]
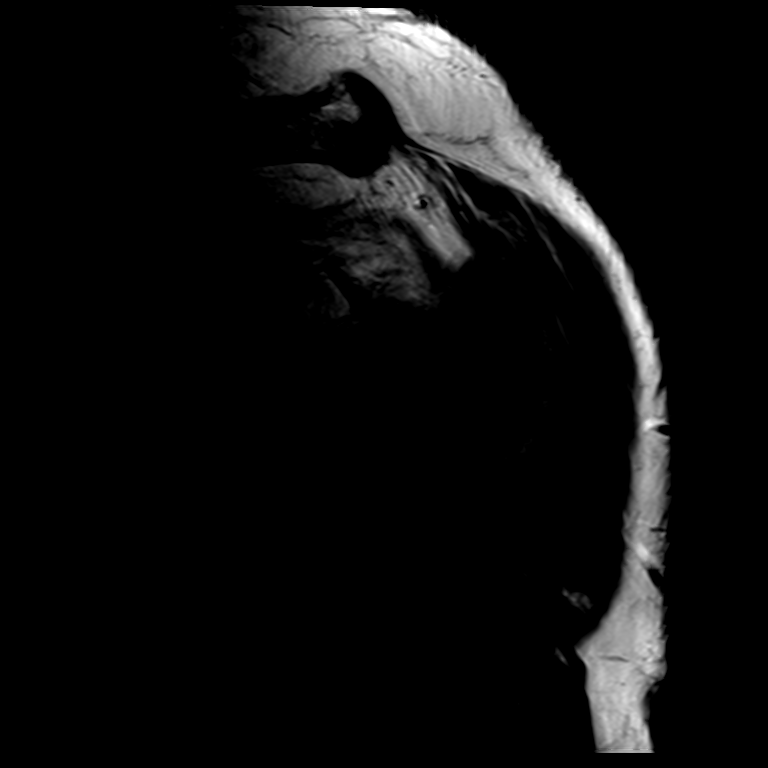
[im 8/23]
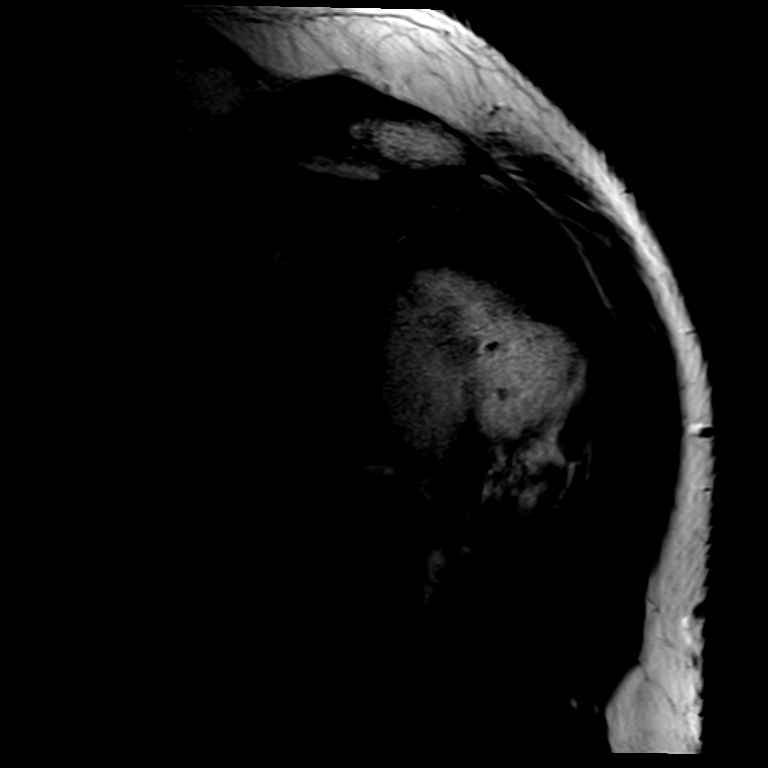
[im 12/23]
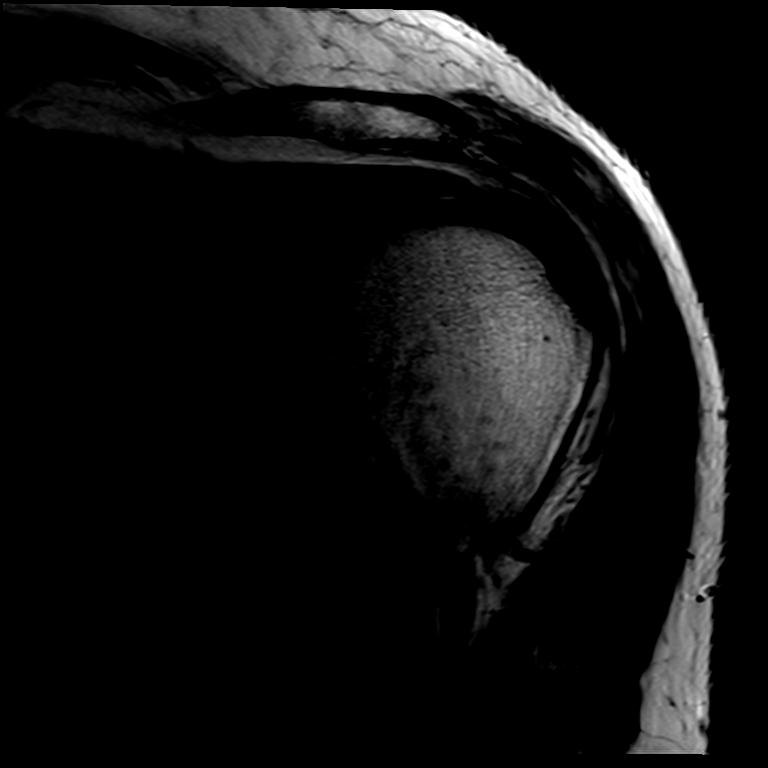
[im 19/23]
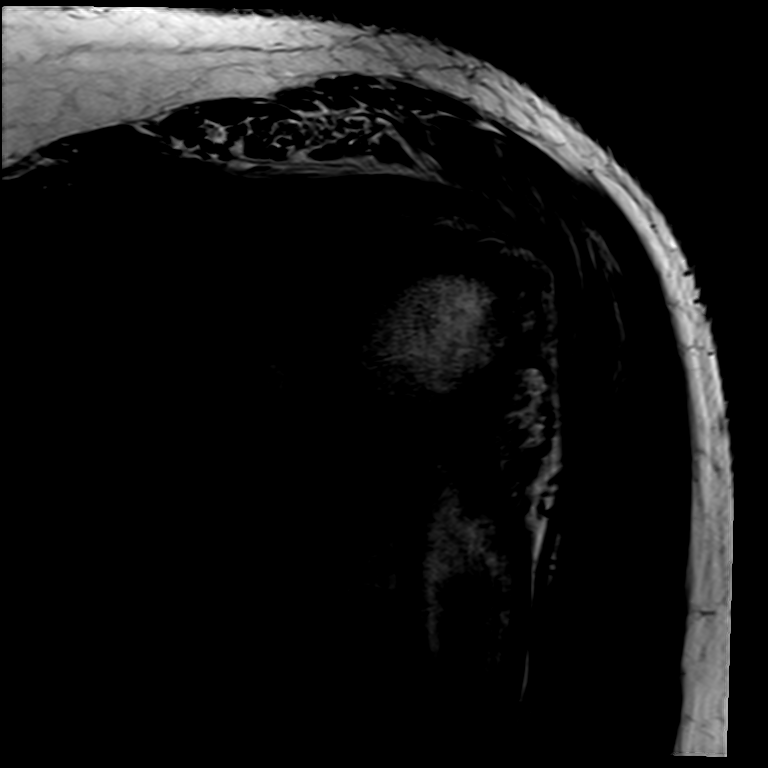

[Series 10: T2 fat-sat · oblique · left · 3.0mm · 0.22mm/px · 3 of 23 slices shown (3 of 3)]
[im 4/23]
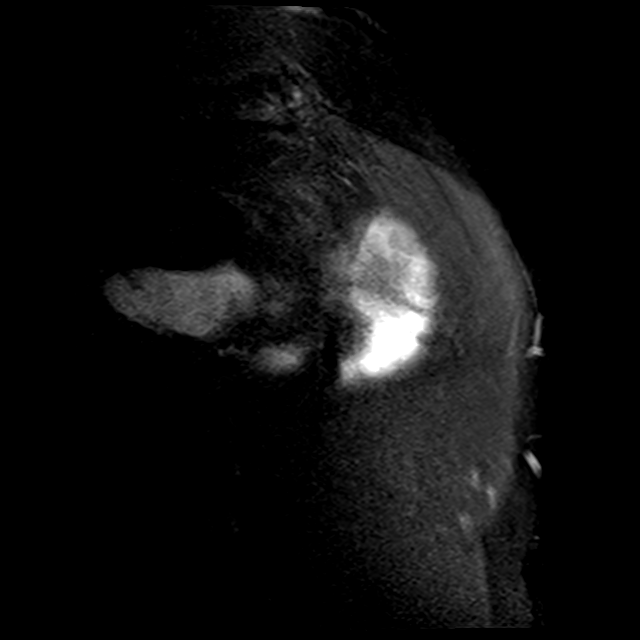
[im 12/23]
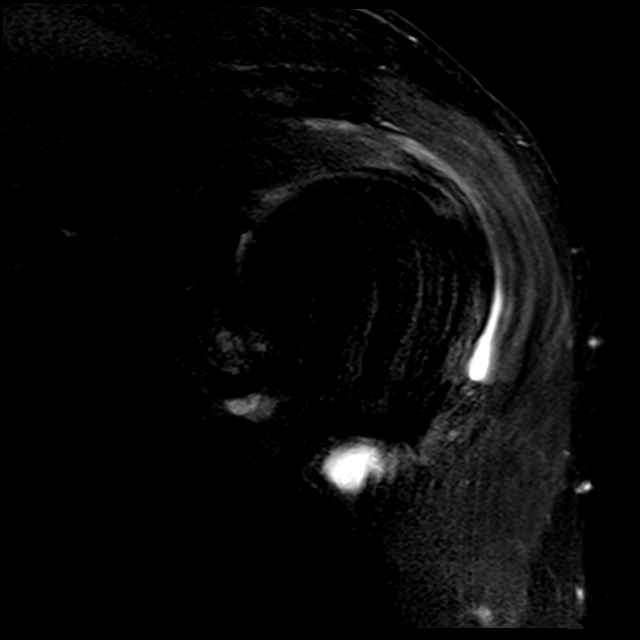
[im 19/23]
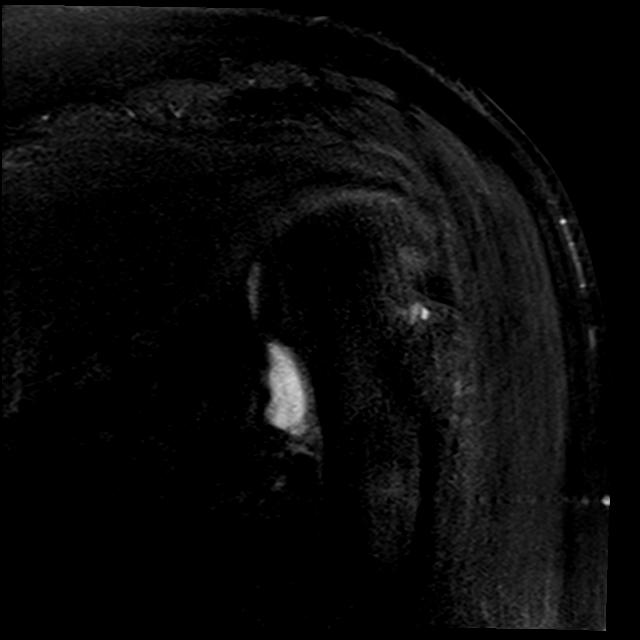

[14 of 40 positions shown; findings below may reference images not displayed]

FINDINGS: Rotator cuff: Mild supraspinatus tendinosis with small focal
full-thickness tear of the far anterior distal fibers at the
footprint. The infraspinatus and teres minor tendons are intact. The
subscapularis tendon is intact, although evaluation is slightly
limited due to internal rotation.

Muscles: No atrophy or abnormal signal of the muscles of the rotator
cuff.

Biceps long head: Intact and normally positioned. Moderate
tendinosis.

Acromioclavicular Joint: Moderate arthropathy of the
acromioclavicular joint. Type II acromion. Small
subacromial/subdeltoid bursal fluid, particularly anteriorly.

Glenohumeral Joint: Small joint effusion. Diffuse glenohumeral
cartilage thinning without focal defect.

Labrum: Small amount of fluid signal undercutting the anterior
inferior labrum with adjacent delamination of the anterior inferior
glenoid cartilage and underlying subchondral cystic change in the
glenoid. Degeneration of the superior labrum without discrete tear.

Bones:  No fracture or dislocation.

Other: Probable small focal venous varix in the superior deltoid
muscle.
IMPRESSION: 1. Small focal full-thickness tear of the far anterior distal
supraspinatus tendon fibers at the footprint. No tendon retraction.
2. Moderate biceps tendinosis.
3. Probable small glenoid labral articular disruption of the
anterior inferior labrum with underlying subchondral cystic change
in the anterior inferior glenoid.
4. Moderate acromioclavicular and mild glenohumeral degenerative
changes. Small glenohumeral joint effusion.

## 2019-02-13 DIAGNOSIS — Z125 Encounter for screening for malignant neoplasm of prostate: Secondary | ICD-10-CM | POA: Diagnosis not present

## 2019-02-13 DIAGNOSIS — R7301 Impaired fasting glucose: Secondary | ICD-10-CM | POA: Diagnosis not present

## 2019-02-13 DIAGNOSIS — M109 Gout, unspecified: Secondary | ICD-10-CM | POA: Diagnosis not present

## 2019-02-13 DIAGNOSIS — E782 Mixed hyperlipidemia: Secondary | ICD-10-CM | POA: Diagnosis not present

## 2019-02-13 DIAGNOSIS — I10 Essential (primary) hypertension: Secondary | ICD-10-CM | POA: Diagnosis not present

## 2019-02-19 DIAGNOSIS — I8310 Varicose veins of unspecified lower extremity with inflammation: Secondary | ICD-10-CM | POA: Diagnosis not present

## 2019-02-19 DIAGNOSIS — F411 Generalized anxiety disorder: Secondary | ICD-10-CM | POA: Diagnosis not present

## 2019-02-19 DIAGNOSIS — I10 Essential (primary) hypertension: Secondary | ICD-10-CM | POA: Diagnosis not present

## 2019-02-19 DIAGNOSIS — N529 Male erectile dysfunction, unspecified: Secondary | ICD-10-CM | POA: Diagnosis not present

## 2019-02-19 DIAGNOSIS — Z7189 Other specified counseling: Secondary | ICD-10-CM | POA: Diagnosis not present

## 2019-02-19 DIAGNOSIS — E782 Mixed hyperlipidemia: Secondary | ICD-10-CM | POA: Diagnosis not present

## 2019-02-19 DIAGNOSIS — E669 Obesity, unspecified: Secondary | ICD-10-CM | POA: Diagnosis not present

## 2019-02-19 DIAGNOSIS — M109 Gout, unspecified: Secondary | ICD-10-CM | POA: Diagnosis not present

## 2019-02-19 DIAGNOSIS — Z125 Encounter for screening for malignant neoplasm of prostate: Secondary | ICD-10-CM | POA: Diagnosis not present

## 2019-02-19 DIAGNOSIS — K5730 Diverticulosis of large intestine without perforation or abscess without bleeding: Secondary | ICD-10-CM | POA: Diagnosis not present

## 2019-02-19 DIAGNOSIS — Z0000 Encounter for general adult medical examination without abnormal findings: Secondary | ICD-10-CM | POA: Diagnosis not present

## 2019-02-19 DIAGNOSIS — R7301 Impaired fasting glucose: Secondary | ICD-10-CM | POA: Diagnosis not present

## 2019-02-21 ENCOUNTER — Encounter: Payer: Self-pay | Admitting: Internal Medicine

## 2019-04-03 ENCOUNTER — Encounter: Payer: PPO | Admitting: Internal Medicine

## 2019-04-24 ENCOUNTER — Encounter: Payer: Self-pay | Admitting: Internal Medicine

## 2019-05-23 ENCOUNTER — Other Ambulatory Visit: Payer: Self-pay

## 2019-05-23 ENCOUNTER — Ambulatory Visit: Payer: PPO | Admitting: *Deleted

## 2019-05-23 VITALS — Temp 97.1°F | Ht 73.0 in | Wt 268.0 lb

## 2019-05-23 DIAGNOSIS — Z86010 Personal history of colonic polyps: Secondary | ICD-10-CM

## 2019-05-23 MED ORDER — PEG 3350-KCL-NA BICARB-NACL 420 G PO SOLR: 4000.0000 mL | Freq: Once | ORAL | 0 refills | Status: AC

## 2019-05-23 NOTE — Progress Notes (Signed)

## 2019-05-28 ENCOUNTER — Encounter: Payer: Self-pay | Admitting: Internal Medicine

## 2019-06-03 ENCOUNTER — Telehealth: Payer: Self-pay | Admitting: Internal Medicine

## 2019-06-03 NOTE — Telephone Encounter (Signed)
Golytely on back order per [facility]- will change to Suprep and give sample- new instructions to pt and suprep sample- to pick up 3rd floor desk WED per pt and wife-

## 2019-06-03 NOTE — Telephone Encounter (Signed)
Pt is scheduled for 06/09/19 colon and [facility] has informed that Charlyn Minerva is on backorder

## 2019-06-09 ENCOUNTER — Ambulatory Visit (AMBULATORY_SURGERY_CENTER): Payer: PPO | Admitting: Internal Medicine

## 2019-06-09 ENCOUNTER — Other Ambulatory Visit: Payer: Self-pay

## 2019-06-09 ENCOUNTER — Encounter: Payer: Self-pay | Admitting: Internal Medicine

## 2019-06-09 VITALS — BP 134/74 | HR 79 | Temp 97.7°F | Resp 13 | Ht 73.0 in | Wt 268.0 lb

## 2019-06-09 DIAGNOSIS — D123 Benign neoplasm of transverse colon: Secondary | ICD-10-CM

## 2019-06-09 DIAGNOSIS — Z86010 Personal history of colonic polyps: Secondary | ICD-10-CM | POA: Diagnosis not present

## 2019-06-09 DIAGNOSIS — D124 Benign neoplasm of descending colon: Secondary | ICD-10-CM

## 2019-06-09 DIAGNOSIS — K219 Gastro-esophageal reflux disease without esophagitis: Secondary | ICD-10-CM | POA: Diagnosis not present

## 2019-06-09 DIAGNOSIS — I10 Essential (primary) hypertension: Secondary | ICD-10-CM | POA: Diagnosis not present

## 2019-06-09 MED ORDER — SODIUM CHLORIDE 0.9 % IV SOLN: 500.0000 mL | Freq: Once | INTRAVENOUS | Status: DC

## 2019-06-09 NOTE — Progress Notes (Signed)
Called to room to assist during endoscopic procedure.  Patient ID and intended procedure confirmed with present staff. Received instructions for my participation in the procedure from the performing physician.  

## 2019-06-09 NOTE — Progress Notes (Signed)
Report to PACU, RN, vss, BBS= Clear.  

## 2019-06-09 NOTE — Progress Notes (Signed)
Pt. Reports no change in his medical or surgical history since his pre-visit 05/23/2019.

## 2019-06-09 NOTE — Op Note (Signed)
[place] Patient Name: Christian Hart Procedure Date: 06/09/2019 11:39 AM MRN: [id number] Endoscopist: Jerene Bears , MD Age: 73 Referring MD:  Date of Birth: 01-02-1946 Gender: Male Account #: [id number] Procedure:                Colonoscopy Indications:              Surveillance: Personal history of adenomatous                            polyps on last colonoscopy 5 years ago Medicines:                Monitored Anesthesia Care Procedure:                Pre-Anesthesia Assessment:                           - Prior to the procedure, a History and Physical                            was performed, and patient medications and                            allergies were reviewed. The patient's tolerance of                            previous anesthesia was also reviewed. The risks                            and benefits of the procedure and the sedation                            options and risks were discussed with the patient.                            All questions were answered, and informed consent                            was obtained. Prior Anticoagulants: The patient has                            taken no previous anticoagulant or antiplatelet                            agents. ASA Grade Assessment: II - A patient with                            mild systemic disease. After reviewing the risks                            and benefits, the patient was deemed in                            satisfactory condition to undergo the procedure.  After obtaining informed consent, the colonoscope                            was passed under direct vision. Throughout the                            procedure, the patient's blood pressure, pulse, and                            oxygen saturations were monitored continuously. The                            Colonoscope was introduced through the anus and                            advanced to the cecum,  identified by appendiceal                            orifice and ileocecal valve. The colonoscopy was                            performed without difficulty. The patient tolerated                            the procedure well. The quality of the bowel                            preparation was good. The ileocecal valve,                            appendiceal orifice, and rectum were photographed. Scope In: 11:43:27 AM Scope Out: 12:04:28 PM Scope Withdrawal Time: 0 hours 15 minutes 52 seconds  Total Procedure Duration: 0 hours 21 minutes 1 second  Findings:                 The digital rectal exam was normal.                           The ileocecal valve was lipomatous. This finding                            was present 5 years ago and biopsies were benign                            without dysplasia.                           A 3 mm polyp was found in the cecum. The polyp was                            sessile. The polyp was removed with a cold snare.                            Resection was complete, but the polyp  tissue was                            not retrieved.                           A 6 mm polyp was found in the transverse colon. The                            polyp was sessile. The polyp was removed with a                            cold snare. Resection and retrieval were complete.                           Two sessile polyps were found in the descending                            colon. The polyps were 3 to 4 mm in size. These                            polyps were removed with a cold snare. Resection                            and retrieval were complete.                           Internal hemorrhoids were found during retroflexion. Complications:            No immediate complications. Estimated Blood Loss:     Estimated blood loss was minimal. Impression:               - Lipomatous ileocecal valve.                           - One 3 mm polyp in the cecum, removed with a  cold                            snare. Complete resection. Polyp tissue not                            retrieved.                           - One 6 mm polyp in the transverse colon, removed                            with a cold snare. Resected and retrieved.                           - Two 3 to 4 mm polyps in the descending colon,                            removed with a cold snare. Resected and retrieved.                           -  Internal hemorrhoids. Recommendation:           - Patient has a contact number available for                            emergencies. The signs and symptoms of potential                            delayed complications were discussed with the                            patient. Return to normal activities tomorrow.                            Written discharge instructions were provided to the                            patient.                           - Resume previous diet.                           - Continue present medications.                           - Await pathology results.                           - Repeat colonoscopy is recommended for                            surveillance. The colonoscopy date will be                            determined after pathology results from today's                            exam become available for review. Jerene Bears, MD 06/09/2019 12:09:09 PM This report has been signed electronically.

## 2019-06-09 NOTE — Patient Instructions (Signed)
Discharge instructions given. Handouts on polyps and Hemorrhoids. Resume previous medications. YOU HAD AN ENDOSCOPIC PROCEDURE TODAY AT THE [facility]:   Refer to the procedure report that was given to you for any specific questions about what was found during the examination.  If the procedure report does not answer your questions, please call your gastroenterologist to clarify.  If you requested that your care partner not be given the details of your procedure findings, then the procedure report has been included in a sealed envelope for you to review at your convenience later.  YOU SHOULD EXPECT: Some feelings of bloating in the abdomen. Passage of more gas than usual.  Walking can help get rid of the air that was put into your GI tract during the procedure and reduce the bloating. If you had a lower endoscopy (such as a colonoscopy or flexible sigmoidoscopy) you may notice spotting of blood in your stool or on the toilet paper. If you underwent a bowel prep for your procedure, you may not have a normal bowel movement for a few days.  Please Note:  You might notice some irritation and congestion in your nose or some drainage.  This is from the oxygen used during your procedure.  There is no need for concern and it should clear up in a day or so.  SYMPTOMS TO REPORT IMMEDIATELY:   Following lower endoscopy (colonoscopy or flexible sigmoidoscopy):  Excessive amounts of blood in the stool  Significant tenderness or worsening of abdominal pains  Swelling of the abdomen that is new, acute  Fever of 100F or higher   For urgent or emergent issues, a gastroenterologist can be reached at any hour by calling [phone].   DIET:  We do recommend a small meal at first, but then you may proceed to your regular diet.  Drink plenty of fluids but you should avoid alcoholic beverages for 24 hours.  ACTIVITY:  You should plan to take it easy for the rest of today and you should NOT DRIVE  or use heavy machinery until tomorrow (because of the sedation medicines used during the test).    FOLLOW UP: Our staff will call the number listed on your records 48-72 hours following your procedure to check on you and address any questions or concerns that you may have regarding the information given to you following your procedure. If we do not reach you, we will leave a message.  We will attempt to reach you two times.  During this call, we will ask if you have developed any symptoms of COVID 19. If you develop any symptoms (ie: fever, flu-like symptoms, shortness of breath, cough etc.) before then, please call [phone].  If you test positive for Covid 19 in the 2 weeks post procedure, please call and report this information to us.    If any biopsies were taken you will be contacted by phone or by letter within the next 1-3 weeks.  Please call us at [phone] if you have not heard about the biopsies in 3 weeks.    SIGNATURES/CONFIDENTIALITY: You and/or your care partner have signed paperwork which will be entered into your electronic medical record.  These signatures attest to the fact that that the information above on your After Visit Summary has been reviewed and is understood.  Full responsibility of the confidentiality of this discharge information lies with you and/or your care-partner. 

## 2019-06-11 ENCOUNTER — Telehealth: Payer: Self-pay

## 2019-06-11 NOTE — Telephone Encounter (Signed)
Follow up call attempted.  NALM  

## 2019-06-11 NOTE — Telephone Encounter (Signed)
  Follow up Call-  Call back number 06/09/2019  Post procedure Call Back phone  # [phone]  Permission to leave phone message Yes  Some recent data might be hidden     Patient questions:  Do you have a fever, pain , or abdominal swelling? No. Pain Score  0 *  Have you tolerated food without any problems? Yes.    Have you been able to return to your normal activities? Yes.    Do you have any questions about your discharge instructions: Diet   No. Medications  No. Follow up visit  No.  Do you have questions or concerns about your Care? No.  Actions: * If pain score is 4 or above: No action needed, pain <4. 1. Have you developed a fever since your procedure? no  2.   Have you had an respiratory symptoms (SOB or cough) since your procedure? no  3.   Have you tested positive for COVID 19 since your procedure no  4.   Have you had any family members/close contacts diagnosed with the COVID 19 since your procedure?  no   If yes to any of these questions please route to Joylene John, RN and Alphonsa Gin, Therapist, sports.

## 2019-06-12 ENCOUNTER — Encounter: Payer: Self-pay | Admitting: Internal Medicine

## 2019-08-26 DIAGNOSIS — Z7189 Other specified counseling: Secondary | ICD-10-CM | POA: Diagnosis not present

## 2019-08-26 DIAGNOSIS — Z6836 Body mass index (BMI) 36.0-36.9, adult: Secondary | ICD-10-CM | POA: Diagnosis not present

## 2019-08-26 DIAGNOSIS — E1169 Type 2 diabetes mellitus with other specified complication: Secondary | ICD-10-CM | POA: Diagnosis not present

## 2019-08-26 DIAGNOSIS — I10 Essential (primary) hypertension: Secondary | ICD-10-CM | POA: Diagnosis not present

## 2019-08-26 DIAGNOSIS — E7800 Pure hypercholesterolemia, unspecified: Secondary | ICD-10-CM | POA: Diagnosis not present

## 2019-08-27 DIAGNOSIS — E7800 Pure hypercholesterolemia, unspecified: Secondary | ICD-10-CM | POA: Diagnosis not present

## 2019-08-27 DIAGNOSIS — I10 Essential (primary) hypertension: Secondary | ICD-10-CM | POA: Diagnosis not present

## 2019-08-27 DIAGNOSIS — E1169 Type 2 diabetes mellitus with other specified complication: Secondary | ICD-10-CM | POA: Diagnosis not present

## 2020-02-20 DIAGNOSIS — N529 Male erectile dysfunction, unspecified: Secondary | ICD-10-CM | POA: Diagnosis not present

## 2020-02-20 DIAGNOSIS — E782 Mixed hyperlipidemia: Secondary | ICD-10-CM | POA: Diagnosis not present

## 2020-02-20 DIAGNOSIS — M109 Gout, unspecified: Secondary | ICD-10-CM | POA: Diagnosis not present

## 2020-02-20 DIAGNOSIS — I10 Essential (primary) hypertension: Secondary | ICD-10-CM | POA: Diagnosis not present

## 2020-02-20 DIAGNOSIS — I8310 Varicose veins of unspecified lower extremity with inflammation: Secondary | ICD-10-CM | POA: Diagnosis not present

## 2020-02-20 DIAGNOSIS — Z0000 Encounter for general adult medical examination without abnormal findings: Secondary | ICD-10-CM | POA: Diagnosis not present

## 2020-02-20 DIAGNOSIS — K5730 Diverticulosis of large intestine without perforation or abscess without bleeding: Secondary | ICD-10-CM | POA: Diagnosis not present

## 2020-02-20 DIAGNOSIS — E1169 Type 2 diabetes mellitus with other specified complication: Secondary | ICD-10-CM | POA: Diagnosis not present

## 2020-02-20 DIAGNOSIS — Z125 Encounter for screening for malignant neoplasm of prostate: Secondary | ICD-10-CM | POA: Diagnosis not present

## 2020-08-23 DIAGNOSIS — E1169 Type 2 diabetes mellitus with other specified complication: Secondary | ICD-10-CM | POA: Diagnosis not present

## 2020-08-23 DIAGNOSIS — I10 Essential (primary) hypertension: Secondary | ICD-10-CM | POA: Diagnosis not present

## 2020-08-23 DIAGNOSIS — E7800 Pure hypercholesterolemia, unspecified: Secondary | ICD-10-CM | POA: Diagnosis not present

## 2020-08-23 DIAGNOSIS — M109 Gout, unspecified: Secondary | ICD-10-CM | POA: Diagnosis not present

## 2021-03-25 ENCOUNTER — Ambulatory Visit (INDEPENDENT_AMBULATORY_CARE_PROVIDER_SITE_OTHER): Payer: PPO | Admitting: Podiatry

## 2021-03-25 ENCOUNTER — Other Ambulatory Visit: Payer: Self-pay

## 2021-03-25 ENCOUNTER — Ambulatory Visit (INDEPENDENT_AMBULATORY_CARE_PROVIDER_SITE_OTHER): Payer: PPO

## 2021-03-25 DIAGNOSIS — M7752 Other enthesopathy of left foot: Secondary | ICD-10-CM | POA: Diagnosis not present

## 2021-03-25 DIAGNOSIS — M21619 Bunion of unspecified foot: Secondary | ICD-10-CM

## 2021-03-25 DIAGNOSIS — M10072 Idiopathic gout, left ankle and foot: Secondary | ICD-10-CM | POA: Diagnosis not present

## 2021-03-25 DIAGNOSIS — Q666 Other congenital valgus deformities of feet: Secondary | ICD-10-CM

## 2021-03-29 ENCOUNTER — Telehealth: Payer: Self-pay | Admitting: Podiatry

## 2021-03-29 ENCOUNTER — Encounter: Payer: Self-pay | Admitting: Podiatry

## 2021-03-29 NOTE — Progress Notes (Signed)
Subjective:  Patient ID: Christian Hart, male    DOB: 03-13-1946,  MRN: [id number]  Chief Complaint  Patient presents with   Bunions    Pt has left bunion pain going into arch of his foot and on top of his foot. Pt states that it hurts to stand on his foot. Pt states he uses tylenol extra strength for treatment.     75 y.o. male presents with the above complaint.  Patient presents with complaint of left first metatarsophalangeal joint pain.  Patient states that he has a history of gout and could be a gout flare.  Patient said hurts with standing.  It just came out of nowhere has progressive gotten worse.  Tylenol Extra Strength helped.  He has not seen and was prior to seeing me.  He is a diabetic but on diet control.  He denies any other acute issues.  It was really painful few days ago but has gotten a little bit better now.  He denies any other acute issues.   Review of Systems: Negative except as noted in the HPI. Denies N/V/F/Ch.  Past Medical History:  Diagnosis Date   Allergy    Arthritis    Articular cartilage disorder involving shoulder region, left 10/2017   Diet-controlled diabetes mellitus ([place])    ED (erectile dysfunction)    High cholesterol    Hypertension    states under control with meds., has been on med. x 5 yr.   Shoulder impingement syndrome, left 10/2017    Current Outpatient Medications:    amLODipine (NORVASC) 10 MG tablet, TAKE 1 TABLET (10 MG TOTAL) BY MOUTH DAILY., Disp: 30 tablet, Rfl: 0   B Complex-C (SUPER B COMPLEX PO), Take by mouth., Disp: , Rfl:    CALCIUM PO, Take by mouth., Disp: , Rfl:    Coenzyme Q10 (COQ-10 PO), Take by mouth., Disp: , Rfl:    CRESTOR 5 MG tablet, , Disp: , Rfl:    Glucosamine-Chondroitin (OSTEO BI-FLEX REGULAR STRENGTH PO), Take by mouth., Disp: , Rfl:    lisinopril-hydrochlorothiazide (ZESTORETIC) 20-25 MG tablet, , Disp: , Rfl:    MAGNESIUM PO, Take by mouth., Disp: , Rfl:    meloxicam (MOBIC) 15 MG tablet, Take 15 mg by  mouth daily., Disp: , Rfl:    Omega-3 Fatty Acids (FISH OIL PO), Take by mouth., Disp: , Rfl:    omeprazole (PRILOSEC) 20 MG capsule, Take 1 capsule (20 mg total) by mouth daily for 14 days., Disp: 14 capsule, Rfl: 0   Turmeric (QC TUMERIC COMPLEX PO), Take by mouth., Disp: , Rfl:   Social History   Tobacco Use  Smoking Status Never  Smokeless Tobacco Never    Allergies  Allergen Reactions   Cialis [Tadalafil] Other (See Comments)    HEADACHE   Citalopram Hydrobromide Other (See Comments)    HEADACHE    Levitra [Vardenafil] Other (See Comments)    HEADACHE   Liptruzet [Ezetimibe-Atorvastatin] Swelling    ANKLES   Statins Other (See Comments)    MYALGIAS   Objective:  There were no vitals filed for this visit. There is no height or weight on file to calculate BMI. Constitutional Well developed. Well nourished.  Vascular Dorsalis pedis pulses palpable bilaterally. Posterior tibial pulses palpable bilaterally. Capillary refill normal to all digits.  No cyanosis or clubbing noted. Pedal hair growth normal.  Neurologic Normal speech. Oriented to person, place, and time. Epicritic sensation to light touch grossly present bilaterally.  Dermatologic Nails well groomed and  normal in appearance. No open wounds. No skin lesions.  Orthopedic: Pain on palpation left first metatarsophalangeal joint.  Pain with range of motion MPJ.  No pain at the sesamoidal complex.  No deep intra-articular first MPJ pain noted.  Mild bunion deformity clinically appreciable.   Radiographs: 3 views of skeletally mature adult left foot: Mild bunion deformity noted.  Sesamoid position and good alignment.Osteoarthritic changes noted to the midfoot.  Posterior and plantar heel spurring noted.  Subtalar joint arthritis noted.  No other bony abnormalities identified Assessment:   1. Acute idiopathic gout involving toe of left foot   2. Pes planovalgus   3. Capsulitis of metatarsophalangeal (MTP) joint of  left foot    Plan:  Patient was evaluated and treated and all questions answered.  Left first metatarsophalangeal joint red hot swollen secondary to a gout flare -I explained to the patient the etiology of swollen joint and various treatment options were discussed.  Given the patient has a history of gout with high intake and red meat I discussed diet management in extensive detail he states understanding.  Patient already has colchicine that was given to him in the past.  I encouraged him to start taking to help with the pain.  He states understanding -Given the amount of pain that is having he will benefit from steroid injection of decrease acute inflammatory component associated pain.  Patient agrees with plan like to proceed with a steroid injection -A steroid injection was performed at left first MPJ using 1% plain Lidocaine and 10 mg of Kenalog. This was well tolerated.   Pes planovalgus -I explained to the patient the etiology of pes planovalgus and various treatment options were discussed.  Given the amount of pain that he is having I believe patient will benefit from custom orthotics help control the hindfoot motion support the arch of the foot take the stress away from first MPJ joint.  He will benefit from Morton's extension to bilateral first MPJ as well. -He was casted for orthotics  No follow-ups on file.

## 2021-03-29 NOTE — Telephone Encounter (Signed)
Faxed HTA auth for orthotics.

## 2021-03-30 NOTE — Telephone Encounter (Signed)
Received [employer] auth # [id number] for orthotics(L3020 X2) valid 8.22.2022 thru 11.20.2022.Marland Kitchen

## 2021-04-13 ENCOUNTER — Other Ambulatory Visit: Payer: Self-pay | Admitting: Podiatry

## 2021-04-13 DIAGNOSIS — M10072 Idiopathic gout, left ankle and foot: Secondary | ICD-10-CM

## 2021-04-27 ENCOUNTER — Ambulatory Visit (INDEPENDENT_AMBULATORY_CARE_PROVIDER_SITE_OTHER): Payer: PPO | Admitting: Podiatry

## 2021-04-27 ENCOUNTER — Other Ambulatory Visit: Payer: Self-pay

## 2021-04-27 DIAGNOSIS — M10072 Idiopathic gout, left ankle and foot: Secondary | ICD-10-CM | POA: Diagnosis not present

## 2021-04-27 DIAGNOSIS — Q666 Other congenital valgus deformities of feet: Secondary | ICD-10-CM | POA: Diagnosis not present

## 2021-04-28 ENCOUNTER — Encounter: Payer: Self-pay | Admitting: Podiatry

## 2021-04-28 NOTE — Progress Notes (Signed)
Subjective:  Patient ID: Christian Hart, male    DOB: 16-Apr-1946,  MRN: [id number]  Chief Complaint  Patient presents with   Foot Pain    Left foot pain PT stated that he is doing great     75 y.o. male presents with the above complaint.  Patient presents with follow-up to left first metatarsophalangeal joint pain.  Patient states is doing a lot better.  He has made his diet change.  The injection considerably helped and got rid of all of his pain.  He is here to pick up his orthotics today.   Review of Systems: Negative except as noted in the HPI. Denies N/V/F/Ch.  Past Medical History:  Diagnosis Date   Allergy    Arthritis    Articular cartilage disorder involving shoulder region, left 10/2017   Diet-controlled diabetes mellitus ([place])    ED (erectile dysfunction)    High cholesterol    Hypertension    states under control with meds., has been on med. x 5 yr.   Shoulder impingement syndrome, left 10/2017    Current Outpatient Medications:    amLODipine (NORVASC) 10 MG tablet, TAKE 1 TABLET (10 MG TOTAL) BY MOUTH DAILY., Disp: 30 tablet, Rfl: 0   B Complex-C (SUPER B COMPLEX PO), Take by mouth., Disp: , Rfl:    CALCIUM PO, Take by mouth., Disp: , Rfl:    Coenzyme Q10 (COQ-10 PO), Take by mouth., Disp: , Rfl:    CRESTOR 5 MG tablet, , Disp: , Rfl:    Glucosamine-Chondroitin (OSTEO BI-FLEX REGULAR STRENGTH PO), Take by mouth., Disp: , Rfl:    lisinopril-hydrochlorothiazide (ZESTORETIC) 20-25 MG tablet, , Disp: , Rfl:    MAGNESIUM PO, Take by mouth., Disp: , Rfl:    meloxicam (MOBIC) 15 MG tablet, Take 15 mg by mouth daily., Disp: , Rfl:    Omega-3 Fatty Acids (FISH OIL PO), Take by mouth., Disp: , Rfl:    omeprazole (PRILOSEC) 20 MG capsule, Take 1 capsule (20 mg total) by mouth daily for 14 days., Disp: 14 capsule, Rfl: 0   Turmeric (QC TUMERIC COMPLEX PO), Take by mouth., Disp: , Rfl:   Social History   Tobacco Use  Smoking Status Never  Smokeless Tobacco Never     Allergies  Allergen Reactions   Cialis [Tadalafil] Other (See Comments)    HEADACHE   Citalopram Hydrobromide Other (See Comments)    HEADACHE    Levitra [Vardenafil] Other (See Comments)    HEADACHE   Liptruzet [Ezetimibe-Atorvastatin] Swelling    ANKLES   Statins Other (See Comments)    MYALGIAS   Objective:  There were no vitals filed for this visit. There is no height or weight on file to calculate BMI. Constitutional Well developed. Well nourished.  Vascular Dorsalis pedis pulses palpable bilaterally. Posterior tibial pulses palpable bilaterally. Capillary refill normal to all digits.  No cyanosis or clubbing noted. Pedal hair growth normal.  Neurologic Normal speech. Oriented to person, place, and time. Epicritic sensation to light touch grossly present bilaterally.  Dermatologic Nails well groomed and normal in appearance. No open wounds. No skin lesions.  Orthopedic: No further pain on palpation left first metatarsophalangeal joint.  No further pain with range of motion MPJ.  No pain at the sesamoidal complex.  No deep intra-articular first MPJ pain noted.  Mild bunion deformity clinically appreciable.   Radiographs: 3 views of skeletally mature adult left foot: Mild bunion deformity noted.  Sesamoid position and good alignment.Osteoarthritic changes noted to the  midfoot.  Posterior and plantar heel spurring noted.  Subtalar joint arthritis noted.  No other bony abnormalities identified Assessment:   1. Acute idiopathic gout involving toe of left foot   2. Pes planovalgus     Plan:  Patient was evaluated and treated and all questions answered.  Left first metatarsophalangeal joint red hot swollen secondary to a gout flare -Clinically resolved with a steroid injection.  At this time I discussed shoe gear modification and importance of orthotics.  Patient states understanding if any foot and ankle issues arises have asked him to come see me.   Pes  planovalgus -I explained to the patient the etiology of pes planovalgus and various treatment options were discussed.  Given the amount of pain that he is having I believe patient will benefit from custom orthotics help control the hindfoot motion support the arch of the foot take the stress away from first MPJ joint.  He will benefit from Morton's extension to bilateral first MPJ as well. -Orthotics were dispensed today.  They are functioning well and taking the stress away from first metatarsophalangeal joint as well as controlling his hindfoot  No follow-ups on file.

## 2022-02-09 DIAGNOSIS — S0501XA Injury of conjunctiva and corneal abrasion without foreign body, right eye, initial encounter: Secondary | ICD-10-CM | POA: Diagnosis not present

## 2022-02-09 DIAGNOSIS — H04211 Epiphora due to excess lacrimation, right lacrimal gland: Secondary | ICD-10-CM | POA: Diagnosis not present

## 2022-05-23 DIAGNOSIS — Z23 Encounter for immunization: Secondary | ICD-10-CM | POA: Diagnosis not present

## 2022-05-23 DIAGNOSIS — E1169 Type 2 diabetes mellitus with other specified complication: Secondary | ICD-10-CM | POA: Diagnosis not present

## 2022-05-23 DIAGNOSIS — I10 Essential (primary) hypertension: Secondary | ICD-10-CM | POA: Diagnosis not present

## 2022-05-23 DIAGNOSIS — Z125 Encounter for screening for malignant neoplasm of prostate: Secondary | ICD-10-CM | POA: Diagnosis not present

## 2022-05-23 DIAGNOSIS — E782 Mixed hyperlipidemia: Secondary | ICD-10-CM | POA: Diagnosis not present

## 2022-05-23 DIAGNOSIS — M109 Gout, unspecified: Secondary | ICD-10-CM | POA: Diagnosis not present

## 2022-05-23 DIAGNOSIS — N529 Male erectile dysfunction, unspecified: Secondary | ICD-10-CM | POA: Diagnosis not present

## 2022-05-23 DIAGNOSIS — I8310 Varicose veins of unspecified lower extremity with inflammation: Secondary | ICD-10-CM | POA: Diagnosis not present

## 2022-05-23 DIAGNOSIS — K5730 Diverticulosis of large intestine without perforation or abscess without bleeding: Secondary | ICD-10-CM | POA: Diagnosis not present

## 2022-05-23 DIAGNOSIS — Z0000 Encounter for general adult medical examination without abnormal findings: Secondary | ICD-10-CM | POA: Diagnosis not present

## 2022-07-05 DIAGNOSIS — H5213 Myopia, bilateral: Secondary | ICD-10-CM | POA: Diagnosis not present

## 2022-07-05 DIAGNOSIS — E119 Type 2 diabetes mellitus without complications: Secondary | ICD-10-CM | POA: Diagnosis not present

## 2022-10-01 DIAGNOSIS — M545 Low back pain, unspecified: Secondary | ICD-10-CM | POA: Diagnosis not present

## 2022-10-24 DIAGNOSIS — M7501 Adhesive capsulitis of right shoulder: Secondary | ICD-10-CM | POA: Diagnosis not present

## 2022-11-01 DIAGNOSIS — M25511 Pain in right shoulder: Secondary | ICD-10-CM | POA: Diagnosis not present

## 2022-11-22 DIAGNOSIS — E119 Type 2 diabetes mellitus without complications: Secondary | ICD-10-CM | POA: Diagnosis not present

## 2022-11-22 DIAGNOSIS — R809 Proteinuria, unspecified: Secondary | ICD-10-CM | POA: Diagnosis not present

## 2022-11-22 DIAGNOSIS — E1169 Type 2 diabetes mellitus with other specified complication: Secondary | ICD-10-CM | POA: Diagnosis not present

## 2022-11-22 DIAGNOSIS — E782 Mixed hyperlipidemia: Secondary | ICD-10-CM | POA: Diagnosis not present

## 2022-11-22 DIAGNOSIS — I10 Essential (primary) hypertension: Secondary | ICD-10-CM | POA: Diagnosis not present

## 2022-11-22 DIAGNOSIS — R972 Elevated prostate specific antigen [PSA]: Secondary | ICD-10-CM | POA: Diagnosis not present

## 2022-11-22 DIAGNOSIS — E669 Obesity, unspecified: Secondary | ICD-10-CM | POA: Diagnosis not present

## 2022-12-18 DIAGNOSIS — R351 Nocturia: Secondary | ICD-10-CM | POA: Diagnosis not present

## 2022-12-18 DIAGNOSIS — R972 Elevated prostate specific antigen [PSA]: Secondary | ICD-10-CM | POA: Diagnosis not present

## 2023-01-29 DIAGNOSIS — R972 Elevated prostate specific antigen [PSA]: Secondary | ICD-10-CM | POA: Diagnosis not present

## 2023-01-29 DIAGNOSIS — N401 Enlarged prostate with lower urinary tract symptoms: Secondary | ICD-10-CM | POA: Diagnosis not present

## 2023-01-29 DIAGNOSIS — R3916 Straining to void: Secondary | ICD-10-CM | POA: Diagnosis not present

## 2023-05-28 DIAGNOSIS — Z0000 Encounter for general adult medical examination without abnormal findings: Secondary | ICD-10-CM | POA: Diagnosis not present

## 2023-05-28 DIAGNOSIS — E782 Mixed hyperlipidemia: Secondary | ICD-10-CM | POA: Diagnosis not present

## 2023-05-28 DIAGNOSIS — N529 Male erectile dysfunction, unspecified: Secondary | ICD-10-CM | POA: Diagnosis not present

## 2023-05-28 DIAGNOSIS — K5730 Diverticulosis of large intestine without perforation or abscess without bleeding: Secondary | ICD-10-CM | POA: Diagnosis not present

## 2023-05-28 DIAGNOSIS — E1169 Type 2 diabetes mellitus with other specified complication: Secondary | ICD-10-CM | POA: Diagnosis not present

## 2023-05-28 DIAGNOSIS — M109 Gout, unspecified: Secondary | ICD-10-CM | POA: Diagnosis not present

## 2023-05-28 DIAGNOSIS — I10 Essential (primary) hypertension: Secondary | ICD-10-CM | POA: Diagnosis not present

## 2023-05-28 DIAGNOSIS — N4231 Prostatic intraepithelial neoplasia: Secondary | ICD-10-CM | POA: Diagnosis not present

## 2023-05-28 DIAGNOSIS — E119 Type 2 diabetes mellitus without complications: Secondary | ICD-10-CM | POA: Diagnosis not present

## 2023-07-16 DIAGNOSIS — H5203 Hypermetropia, bilateral: Secondary | ICD-10-CM | POA: Diagnosis not present

## 2023-07-16 DIAGNOSIS — E119 Type 2 diabetes mellitus without complications: Secondary | ICD-10-CM | POA: Diagnosis not present

## 2023-07-16 DIAGNOSIS — H52203 Unspecified astigmatism, bilateral: Secondary | ICD-10-CM | POA: Diagnosis not present

## 2023-09-11 DIAGNOSIS — R972 Elevated prostate specific antigen [PSA]: Secondary | ICD-10-CM | POA: Diagnosis not present

## 2023-12-14 DIAGNOSIS — E782 Mixed hyperlipidemia: Secondary | ICD-10-CM | POA: Diagnosis not present

## 2023-12-14 DIAGNOSIS — E1169 Type 2 diabetes mellitus with other specified complication: Secondary | ICD-10-CM | POA: Diagnosis not present

## 2023-12-14 DIAGNOSIS — I10 Essential (primary) hypertension: Secondary | ICD-10-CM | POA: Diagnosis not present

## 2023-12-31 DIAGNOSIS — R062 Wheezing: Secondary | ICD-10-CM | POA: Diagnosis not present

## 2023-12-31 DIAGNOSIS — J988 Other specified respiratory disorders: Secondary | ICD-10-CM | POA: Diagnosis not present

## 2023-12-31 DIAGNOSIS — E1169 Type 2 diabetes mellitus with other specified complication: Secondary | ICD-10-CM | POA: Diagnosis not present

## 2023-12-31 DIAGNOSIS — I10 Essential (primary) hypertension: Secondary | ICD-10-CM | POA: Diagnosis not present

## 2024-03-19 DIAGNOSIS — R972 Elevated prostate specific antigen [PSA]: Secondary | ICD-10-CM | POA: Diagnosis not present

## 2024-03-26 DIAGNOSIS — N401 Enlarged prostate with lower urinary tract symptoms: Secondary | ICD-10-CM | POA: Diagnosis not present

## 2024-03-26 DIAGNOSIS — R350 Frequency of micturition: Secondary | ICD-10-CM | POA: Diagnosis not present

## 2024-03-26 DIAGNOSIS — R972 Elevated prostate specific antigen [PSA]: Secondary | ICD-10-CM | POA: Diagnosis not present

## 2024-06-03 DIAGNOSIS — N4231 Prostatic intraepithelial neoplasia: Secondary | ICD-10-CM | POA: Diagnosis not present

## 2024-06-03 DIAGNOSIS — Z1331 Encounter for screening for depression: Secondary | ICD-10-CM | POA: Diagnosis not present

## 2024-06-03 DIAGNOSIS — M109 Gout, unspecified: Secondary | ICD-10-CM | POA: Diagnosis not present

## 2024-06-03 DIAGNOSIS — I10 Essential (primary) hypertension: Secondary | ICD-10-CM | POA: Diagnosis not present

## 2024-06-03 DIAGNOSIS — E782 Mixed hyperlipidemia: Secondary | ICD-10-CM | POA: Diagnosis not present

## 2024-06-03 DIAGNOSIS — Z0000 Encounter for general adult medical examination without abnormal findings: Secondary | ICD-10-CM | POA: Diagnosis not present

## 2024-06-03 DIAGNOSIS — Z1211 Encounter for screening for malignant neoplasm of colon: Secondary | ICD-10-CM | POA: Diagnosis not present

## 2024-06-03 DIAGNOSIS — E1169 Type 2 diabetes mellitus with other specified complication: Secondary | ICD-10-CM | POA: Diagnosis not present

## 2024-06-03 DIAGNOSIS — K5730 Diverticulosis of large intestine without perforation or abscess without bleeding: Secondary | ICD-10-CM | POA: Diagnosis not present

## 2024-06-03 DIAGNOSIS — N529 Male erectile dysfunction, unspecified: Secondary | ICD-10-CM | POA: Diagnosis not present

## 2024-07-17 DIAGNOSIS — H5203 Hypermetropia, bilateral: Secondary | ICD-10-CM | POA: Diagnosis not present

## 2024-07-17 DIAGNOSIS — H2513 Age-related nuclear cataract, bilateral: Secondary | ICD-10-CM | POA: Diagnosis not present

## 2024-07-17 DIAGNOSIS — E119 Type 2 diabetes mellitus without complications: Secondary | ICD-10-CM | POA: Diagnosis not present
